# Patient Record
Sex: Male | Born: 1960
Health system: Southern US, Community
[De-identification: ages and names within clinical notes are randomized; demographics above are authoritative.]

## PROBLEM LIST (undated history)

## (undated) DIAGNOSIS — B1920 Unspecified viral hepatitis C without hepatic coma: Secondary | ICD-10-CM

## (undated) DIAGNOSIS — Z9889 Other specified postprocedural states: Secondary | ICD-10-CM

## (undated) DIAGNOSIS — R112 Nausea with vomiting, unspecified: Secondary | ICD-10-CM

## (undated) DIAGNOSIS — I10 Essential (primary) hypertension: Secondary | ICD-10-CM

## (undated) HISTORY — DX: Essential (primary) hypertension: I10

## (undated) HISTORY — DX: Unspecified viral hepatitis C without hepatic coma: B19.20

## (undated) HISTORY — PX: SHOULDER SURGERY: SHX246

## (undated) HISTORY — PX: HAND SURGERY: SHX662

---

## 1999-12-26 ENCOUNTER — Inpatient Hospital Stay (HOSPITAL_COMMUNITY): Admission: EM | Admit: 1999-12-26 | Discharge: 1999-12-28 | Payer: Self-pay

## 1999-12-26 ENCOUNTER — Encounter: Payer: Self-pay | Admitting: General Surgery

## 2000-06-05 ENCOUNTER — Emergency Department (HOSPITAL_COMMUNITY): Admission: EM | Admit: 2000-06-05 | Discharge: 2000-06-05 | Payer: Self-pay | Admitting: Emergency Medicine

## 2008-04-07 ENCOUNTER — Emergency Department (HOSPITAL_COMMUNITY): Admission: EM | Admit: 2008-04-07 | Discharge: 2008-04-07 | Payer: Self-pay | Admitting: Emergency Medicine

## 2010-03-06 ENCOUNTER — Ambulatory Visit (HOSPITAL_COMMUNITY)
Admission: RE | Admit: 2010-03-06 | Discharge: 2010-03-06 | Payer: Self-pay | Source: Home / Self Care | Attending: Family Medicine | Admitting: Family Medicine
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
## 2011-07-23 ENCOUNTER — Encounter (HOSPITAL_COMMUNITY): Payer: Self-pay | Admitting: *Deleted

## 2011-07-23 ENCOUNTER — Emergency Department (HOSPITAL_COMMUNITY)
Admission: EM | Admit: 2011-07-23 | Discharge: 2011-07-23 | Disposition: A | Discharge status: Home Health | Payer: No Typology Code available for payment source | Attending: Emergency Medicine | Admitting: Emergency Medicine

## 2011-07-23 DIAGNOSIS — Y92410 Unspecified street and highway as the place of occurrence of the external cause: Secondary | ICD-10-CM | POA: Insufficient documentation

## 2011-07-23 DIAGNOSIS — T148XXA Other injury of unspecified body region, initial encounter: Secondary | ICD-10-CM | POA: Insufficient documentation

## 2011-07-23 MED ORDER — TRAMADOL HCL 50 MG PO TABS
50.0000 mg | ORAL_TABLET | Freq: Four times a day (QID) | ORAL | Status: AC | PRN
Start: 2011-07-23 — End: 2011-08-02

## 2011-07-23 MED ORDER — HYDROCODONE-ACETAMINOPHEN 5-325 MG PO TABS
1.0000 | ORAL_TABLET | Freq: Once | ORAL | Status: AC
  Administered 2011-07-23: 1 via ORAL
  Filled 2011-07-23: qty 1

## 2011-07-23 MED ORDER — IBUPROFEN 800 MG PO TABS: 800.0000 mg | ORAL_TABLET | Freq: Three times a day (TID) | ORAL | Status: AC | PRN

## 2011-07-23 MED ORDER — METHOCARBAMOL 500 MG PO TABS: 1000.0000 mg | ORAL_TABLET | Freq: Four times a day (QID) | ORAL | Status: AC
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
## 2011-07-23 NOTE — Progress Notes (Signed)
Orthopedic Tech Progress Note Patient Details:  Juan Malone 1960-06-21 [id number]  Type of Splint: Other (comment) (velcro wrist splint) Splint Location: left wrist Splint Interventions: Application    [name] 07/23/2011, 8:44 PM
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
## 2011-07-23 NOTE — ED Provider Notes (Signed)
Medical screening examination/treatment/procedure(s) were performed by non-physician practitioner and as supervising physician I was immediately available for consultation/collaboration.   Glynn Octave, MD 07/23/11 [phone]

## 2011-07-29 ENCOUNTER — Encounter (HOSPITAL_COMMUNITY): Payer: Self-pay | Admitting: Emergency Medicine

## 2011-07-29 ENCOUNTER — Emergency Department (HOSPITAL_COMMUNITY)
Admission: EM | Admit: 2011-07-29 | Discharge: 2011-07-29 | Disposition: A | Discharge status: Home / Self Care | Payer: No Typology Code available for payment source | Attending: Emergency Medicine | Admitting: Emergency Medicine

## 2011-07-29 DIAGNOSIS — S139XXA Sprain of joints and ligaments of unspecified parts of neck, initial encounter: Secondary | ICD-10-CM | POA: Insufficient documentation

## 2011-07-29 DIAGNOSIS — M25519 Pain in unspecified shoulder: Secondary | ICD-10-CM

## 2011-07-29 DIAGNOSIS — M79609 Pain in unspecified limb: Secondary | ICD-10-CM | POA: Insufficient documentation

## 2011-07-29 DIAGNOSIS — S161XXA Strain of muscle, fascia and tendon at neck level, initial encounter: Secondary | ICD-10-CM

## 2011-07-29 DIAGNOSIS — F17200 Nicotine dependence, unspecified, uncomplicated: Secondary | ICD-10-CM | POA: Insufficient documentation

## 2011-07-29 DIAGNOSIS — M545 Low back pain, unspecified: Secondary | ICD-10-CM | POA: Insufficient documentation

## 2011-07-29 MED ORDER — NAPROXEN 500 MG PO TABS: 500.0000 mg | ORAL_TABLET | Freq: Two times a day (BID) | ORAL | Status: AC

## 2011-07-29 MED ORDER — HYDROCODONE-ACETAMINOPHEN 5-500 MG PO TABS: 1.0000 | ORAL_TABLET | Freq: Four times a day (QID) | ORAL | Status: AC | PRN

## 2011-07-29 MED ORDER — HYDROCODONE-ACETAMINOPHEN 5-325 MG PO TABS
2.0000 | ORAL_TABLET | Freq: Once | ORAL | Status: AC
  Administered 2011-07-29: 2 via ORAL
  Filled 2011-07-29: qty 2

## 2011-07-29 NOTE — Discharge Instructions (Signed)
Take naprosyn as directed for inflammation and pain using hydrocodone-acetaminophen as needed for breakthrough pain but do not drive or operate machinery with hydrocodone-acetaminophen use. Ice to any areas of soreness. It is important to followup with either a primary care provider or orthopedic specialist in the near future for further evaluation of ongoing pain. Return to emergency department for any emergent changing or worsening of symptoms. Establishment with a Primary Care provider is Very important for general health care concerns, minor illness and minor injury.   Arthralgia Arthralgia is joint pain. A joint is a place where two bones meet. Joint pain can happen for many reasons. The joint can be bruised, stiff, infected, or weak from aging. Pain usually goes away after resting and taking medicine for soreness.  HOME CARE  Rest the joint as told by your doctor.   Keep the sore joint raised (elevated) for the first 24 hours.   Put ice on the joint area.   Put ice in a plastic bag.   Place a towel between your skin and the bag.   Leave the ice on for 15 to 20 minutes, 3 to 4 times a day.   Wear your splint, casting, elastic bandage, or sling as told by your doctor.   Only take medicine as told by your doctor. Do not take aspirin.   Use crutches as told by your doctor. Do not put weight on the joint until told to by your doctor.  GET HELP RIGHT AWAY IF:   You have bruising, puffiness (swelling), or more pain.   Your fingers or toes turn blue or start to lose feeling (numb).   Your medicine does not lessen the pain.   Your pain becomes severe.   You have a temperature by mouth above 102 F (38.9 C), not controlled by medicine.   You cannot move or use the joint.  MAKE SURE YOU:   Understand these instructions.   Will watch your condition.   Will get help right away if you are not doing well or get worse.  Document Released: 02/25/2009 Document Revised: 02/26/2011  Document Reviewed: 02/25/2009 [facility] Patient Information 2012 [place], [place].
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
## 2011-07-29 NOTE — ED Provider Notes (Signed)
Medical screening examination/treatment/procedure(s) were performed by non-physician practitioner and as supervising physician I was immediately available for consultation/collaboration.   Filip Luten M Jamekia Gannett, MD 07/29/11 2334 

## 2011-07-30 ENCOUNTER — Encounter (HOSPITAL_COMMUNITY): Payer: Self-pay | Admitting: *Deleted

## 2011-08-20 ENCOUNTER — Emergency Department (HOSPITAL_COMMUNITY)
Admission: EM | Admit: 2011-08-20 | Discharge: 2011-08-20 | Disposition: A | Discharge status: Home / Self Care | Payer: No Typology Code available for payment source | Attending: Emergency Medicine | Admitting: Emergency Medicine

## 2011-08-20 ENCOUNTER — Emergency Department (HOSPITAL_COMMUNITY): Payer: No Typology Code available for payment source

## 2011-08-20 ENCOUNTER — Encounter (HOSPITAL_COMMUNITY): Payer: Self-pay | Admitting: Emergency Medicine

## 2011-08-20 DIAGNOSIS — M79642 Pain in left hand: Secondary | ICD-10-CM

## 2011-08-20 DIAGNOSIS — J45909 Unspecified asthma, uncomplicated: Secondary | ICD-10-CM | POA: Insufficient documentation

## 2011-08-20 DIAGNOSIS — M545 Low back pain, unspecified: Secondary | ICD-10-CM

## 2011-08-20 DIAGNOSIS — M79609 Pain in unspecified limb: Secondary | ICD-10-CM | POA: Insufficient documentation

## 2011-08-20 DIAGNOSIS — M25519 Pain in unspecified shoulder: Secondary | ICD-10-CM

## 2011-08-20 MED ORDER — CYCLOBENZAPRINE HCL 10 MG PO TABS: 10.0000 mg | ORAL_TABLET | Freq: Two times a day (BID) | ORAL | Status: AC | PRN

## 2011-08-20 MED ORDER — HYDROCODONE-ACETAMINOPHEN 5-500 MG PO TABS: 1.0000 | ORAL_TABLET | Freq: Four times a day (QID) | ORAL | Status: AC | PRN

## 2011-08-20 MED ORDER — OXYCODONE-ACETAMINOPHEN 5-325 MG PO TABS
1.0000 | ORAL_TABLET | Freq: Once | ORAL | Status: AC
  Administered 2011-08-20: 1 via ORAL
  Filled 2011-08-20: qty 1
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
## 2011-08-20 NOTE — Discharge Instructions (Signed)
Acromioclavicular Injuries  The AC (acromioclavicular) joint is the joint in the shoulder where the collarbone (clavicle) meets the shoulder blade (scapula). The part of the shoulder blade connected to the collarbone is called the acromion. Common problems with and treatments for the AC joint are detailed below.  ARTHRITIS  Arthritis occurs when the joint has been injured and the smooth padding between the joints (cartilage) is lost. This is the wear and tear seen in most joints of the body if they have been overused. This causes the joint to produce pain and swelling which is worse with activity.   AC JOINT SEPARATION  AC joint separation means that the ligaments connecting the acromion of the shoulder blade and collarbone have been damaged, and the two bones no longer line up. AC separations can be anywhere from mild to severe, and are "graded" depending upon which ligaments are torn and how badly they are torn.   Grade I Injury: the least damage is done, and the AC joint still lines up.   Grade II Injury: damage to the ligaments which reinforce the AC joint. In a Grade II injury, these ligaments are stretched but not entirely torn. When stressed, the AC joint becomes painful and unstable.   Grade III Injury: AC and secondary ligaments are completely torn, and the collarbone is no longer attached to the shoulder blade. This results in deformity; a prominence of the end of the clavicle.  AC JOINT FRACTURE  AC joint fracture means that there has been a break in the bones of the AC joint, usually the end of the clavicle.  TREATMENT  TREATMENT OF AC ARTHRITIS   There is currently no way to replace the cartilage damaged by arthritis. The best way to improve the condition is to decrease the activities which aggravate the problem. Application of ice to the joint helps decrease pain and soreness (inflammation). The use of non-steroidal anti-inflammatory medication is helpful.   If less conservative measures do not  work, then cortisone shots (injections) may be used. These are anti-inflammatories; they decrease the soreness in the joint and swelling.   If non-surgical measures fail, surgery may be recommended. The procedure is generally removal of a portion of the end of the clavicle. This is the part of the collarbone closest to your acromion which is stabilized with ligaments to the acromion of the shoulder blade. This surgery may be performed using a tube-like instrument with a light (arthroscope) for looking into a joint. It may also be performed as an open surgery through a small incision by the surgeon. Most patients will have good range of motion within 6 weeks and may return to all activity including sports by 8-12 weeks, barring complications.  TREATMENT OF AN AC SEPARATION   The initial treatment is to decrease pain. This is best accomplished by immobilizing the arm in a sling and placing an ice pack to the shoulder for 20 to 30 minutes every 2 hours as needed. As the pain starts to subside, it is important to begin moving the fingers, wrist, elbow and eventually the shoulder in order to prevent a stiff or "frozen" shoulder. Instruction on when and how much to move the shoulder will be provided by your caregiver. The length of time needed to regain full motion and function depends on the amount or grade of the injury. Recovery from a Grade I AC separation usually takes 10 to 14 days, whereas a Grade III may take 6 to 8 weeks.   Grade   III injuries usually allow return to full activity with few restrictions. Treatment is also based on the activity demands of the injured shoulder. For example, a high level quarterback with an injured throwing arm will receive more aggressive treatment than someone with a desk job who rarely uses his/her arm for strenuous activities. In some cases, a painful lump may persist which could require a later surgery.  Surgery can be very successful, but the benefits must be weighed against the potential risks.  TREATMENT OF AN AC JOINT FRACTURE Fracture treatment depends on the type of fracture. Sometimes a splint or sling may be all that is required. Other times surgery may be required for repair. This is more frequently the case when the ligaments supporting the clavicle are completely torn. Your caregiver will help you with these decisions and together you can decide what will be the best treatment. HOME CARE INSTRUCTIONS   Apply ice to the injury for 15 to 20 minutes each hour while awake for 2 days. Put the ice in a plastic bag and place a towel between the bag of ice and skin.   If a sling has been applied, wear it constantly for as long as directed by your caregiver, even at night. The sling or splint can be removed for bathing or showering or as directed. Be sure to keep the shoulder in the same place as when the sling is on. Do not lift the arm.   If a figure-of-eight splint has been applied it should be tightened gently by another person every day. Tighten it enough to keep the shoulders held back. Allow enough room to place the index finger between the body and strap. Loosen the splint immediately if there is numbness or tingling in the hands.   Take over-the-counter or prescription medicines for pain, discomfort or fever as directed by your caregiver.   If you or your child has received a follow up appointment, it is very important to keep that appointment in order to avoid long term complications, chronic pain or disability.  SEEK MEDICAL CARE IF:   The pain is not relieved with medications.   There is increased swelling or discoloration that continues to get worse rather than better.   You or your child has been unable to follow up as instructed.   There is progressive numbness and tingling in the arm, forearm or hand.  SEEK IMMEDIATE MEDICAL CARE IF:   The arm is numb, cold or pale.    There is increasing pain in the hand, forearm or fingers.  MAKE SURE YOU:   Understand these instructions.   Will watch your condition.   Will get help right away if you are not doing well or get worse.  Document Released: 12/17/2004 Document Revised: 02/26/2011 Document Reviewed: 06/11/2008 [facility] Patient Information 2012 [place], [place].Back Exercises Back exercises help treat and prevent back injuries. The goal of back exercises is to increase the strength of your abdominal and back muscles and the flexibility of your back. These exercises should be started when you no longer have back pain. Back exercises include:  Pelvic Tilt. Lie on your back with your knees bent. Tilt your pelvis until the lower part of your back is against the floor. Hold this position 5 to 10 sec and repeat 5 to 10 times.   Knee to Chest. Pull first 1 knee up against your chest and hold for 20 to 30 seconds, repeat this with the other knee, and then both knees. This may  be done with the other leg straight or bent, whichever feels better.   Sit-Ups or Curl-Ups. Bend your knees 90 degrees. Start with tilting your pelvis, and do a partial, slow sit-up, lifting your trunk only 30 to 45 degrees off the floor. Take at least 2 to 3 seconds for each sit-up. Do not do sit-ups with your knees out straight. If partial sit-ups are difficult, simply do the above but with only tightening your abdominal muscles and holding it as directed.   Hip-Lift. Lie on your back with your knees flexed 90 degrees. Push down with your feet and shoulders as you raise your hips a couple inches off the floor; hold for 10 seconds, repeat 5 to 10 times.   Back arches. Lie on your stomach, propping yourself up on bent elbows. Slowly press on your hands, causing an arch in your low back. Repeat 3 to 5 times. Any initial stiffness and discomfort should lessen with repetition over time.   Shoulder-Lifts. Lie face down with arms beside your body. Keep  hips and torso pressed to floor as you slowly lift your head and shoulders off the floor.  Do not overdo your exercises, especially in the beginning. Exercises may cause you some mild back discomfort which lasts for a few minutes; however, if the pain is more severe, or lasts for more than 15 minutes, do not continue exercises until you see your caregiver. Improvement with exercise therapy for back problems is slow.  See your caregivers for assistance with developing a proper back exercise program. Document Released: 04/16/2004 Document Revised: 02/26/2011 Document Reviewed: 03/09/2005 [facility] Patient Information 2012 [place], [place].Back Pain, Adult Low back pain is very common. About 1 in 5 people have back pain.The cause of low back pain is rarely dangerous. The pain often gets better over time.About half of people with a sudden onset of back pain feel better in just 2 weeks. About 8 in 10 people feel better by 6 weeks.  CAUSES Some common causes of back pain include:  Strain of the muscles or ligaments supporting the spine.   Wear and tear (degeneration) of the spinal discs.   Arthritis.   Direct injury to the back.  DIAGNOSIS Most of the time, the direct cause of low back pain is not known.However, back pain can be treated effectively even when the exact cause of the pain is unknown.Answering your caregiver's questions about your overall health and symptoms is one of the most accurate ways to make sure the cause of your pain is not dangerous. If your caregiver needs more information, he or she may order lab work or imaging tests (X-rays or MRIs).However, even if imaging tests show changes in your back, this usually does not require surgery. HOME CARE INSTRUCTIONS For many people, back pain returns.Since low back pain is rarely dangerous, it is often a condition that people can learn to [facility] their own.   Remain active. It is stressful on the back to sit or stand in one place. Do  not sit, drive, or stand in one place for more than 30 minutes at a time. Take short walks on level surfaces as soon as pain allows.Try to increase the length of time you walk each day.   Do not stay in bed.Resting more than 1 or 2 days can delay your recovery.   Do not avoid exercise or work.Your body is made to move.It is not dangerous to be active, even though your back may hurt.Your back will likely heal faster  if you return to being active before your pain is gone.   Pay attention to your body when you bend and lift. Many people have less discomfortwhen lifting if they bend their knees, keep the load close to their bodies,and avoid twisting. Often, the most comfortable positions are those that put less stress on your recovering back.   Find a comfortable position to sleep. Use a firm mattress and lie on your side with your knees slightly bent. If you lie on your back, put a pillow under your knees.   Only take over-the-counter or prescription medicines as directed by your caregiver. Over-the-counter medicines to reduce pain and inflammation are often the most helpful.Your caregiver may prescribe muscle relaxant drugs.These medicines help dull your pain so you can more quickly return to your normal activities and healthy exercise.   Put ice on the injured area.   Put ice in a plastic bag.   Place a towel between your skin and the bag.   Leave the ice on for 15 to 20 minutes, 3 to 4 times a day for the first 2 to 3 days. After that, ice and heat may be alternated to reduce pain and spasms.   Ask your caregiver about trying back exercises and gentle massage. This may be of some benefit.   Avoid feeling anxious or stressed.Stress increases muscle tension and can worsen back pain.It is important to recognize when you are anxious or stressed and learn ways to manage it.Exercise is a great option.  SEEK MEDICAL CARE IF:  You have pain that is not relieved with rest or medicine.     You have pain that does not improve in 1 week.   You have new symptoms.   You are generally not feeling well.  SEEK IMMEDIATE MEDICAL CARE IF:   You have pain that radiates from your back into your legs.   You develop new bowel or bladder control problems.   You have unusual weakness or numbness in your arms or legs.   You develop nausea or vomiting.   You develop abdominal pain.   You feel faint.  Document Released: 03/09/2005 Document Revised: 02/26/2011 Document Reviewed: 07/28/2010 [facility] Patient Information 2012 [place], [place].
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
## 2011-09-01 NOTE — ED Provider Notes (Signed)
Medical screening examination/treatment/procedure(s) were performed by non-physician practitioner and as supervising physician I was immediately available for consultation/collaboration.   Rolan Bucco, MD 09/01/11 1008

## 2016-06-15 DIAGNOSIS — J45909 Unspecified asthma, uncomplicated: Secondary | ICD-10-CM | POA: Insufficient documentation

## 2016-06-15 DIAGNOSIS — F17210 Nicotine dependence, cigarettes, uncomplicated: Secondary | ICD-10-CM | POA: Insufficient documentation

## 2016-06-15 DIAGNOSIS — M62830 Muscle spasm of back: Secondary | ICD-10-CM | POA: Insufficient documentation

## 2016-06-15 DIAGNOSIS — R109 Unspecified abdominal pain: Secondary | ICD-10-CM | POA: Insufficient documentation

## 2016-06-15 NOTE — ED Triage Notes (Signed)
Pt brought in by EMS with c/o right side back pain that he has had for some time but has gotten worse over the past couple days

## 2016-06-16 ENCOUNTER — Emergency Department (HOSPITAL_COMMUNITY): Payer: Self-pay

## 2016-06-16 ENCOUNTER — Encounter (HOSPITAL_COMMUNITY): Payer: Self-pay | Admitting: Emergency Medicine

## 2016-06-16 ENCOUNTER — Emergency Department (HOSPITAL_COMMUNITY)
Admission: EM | Admit: 2016-06-16 | Discharge: 2016-06-16 | Disposition: A | Discharge status: Home / Self Care | Payer: Self-pay | Attending: Emergency Medicine | Admitting: Emergency Medicine

## 2016-06-16 DIAGNOSIS — R52 Pain, unspecified: Secondary | ICD-10-CM

## 2016-06-16 DIAGNOSIS — M62838 Other muscle spasm: Secondary | ICD-10-CM

## 2016-06-16 LAB — I-STAT CHEM 8, ED
BUN: 5 mg/dL — ABNORMAL LOW (ref 6–20)
CHLORIDE: 99 mmol/L — AB (ref 101–111)
CREATININE: 1.2 mg/dL (ref 0.61–1.24)
Calcium, Ion: 1.05 mmol/L — ABNORMAL LOW (ref 1.15–1.40)
Glucose, Bld: 94 mg/dL (ref 65–99)
HEMATOCRIT: 38 % — AB (ref 39.0–52.0)
HEMOGLOBIN: 12.9 g/dL — AB (ref 13.0–17.0)
Potassium: 3.8 mmol/L (ref 3.5–5.1)
Sodium: 137 mmol/L (ref 135–145)
TCO2: 25 mmol/L (ref 0–100)

## 2016-06-16 LAB — URINALYSIS, ROUTINE W REFLEX MICROSCOPIC
Bilirubin Urine: NEGATIVE
GLUCOSE, UA: NEGATIVE mg/dL
Hgb urine dipstick: NEGATIVE
KETONES UR: NEGATIVE mg/dL
LEUKOCYTES UA: NEGATIVE
NITRITE: NEGATIVE
PH: 5 (ref 5.0–8.0)
Protein, ur: NEGATIVE mg/dL
SPECIFIC GRAVITY, URINE: 1.008 (ref 1.005–1.030)

## 2016-06-16 MED ORDER — OXYCODONE-ACETAMINOPHEN 5-325 MG PO TABS
1.0000 | ORAL_TABLET | Freq: Once | ORAL | Status: AC
  Administered 2016-06-16: 1 via ORAL
  Filled 2016-06-16: qty 1

## 2016-06-16 MED ORDER — METHOCARBAMOL 500 MG PO TABS
1000.0000 mg | ORAL_TABLET | Freq: Once | ORAL | Status: AC
  Administered 2016-06-16: 1000 mg via ORAL
  Filled 2016-06-16: qty 2

## 2016-06-16 MED ORDER — NAPROXEN 375 MG PO TABS: 375.0000 mg | ORAL_TABLET | Freq: Two times a day (BID) | ORAL | 0 refills | Status: DC

## 2016-06-16 MED ORDER — KETOROLAC TROMETHAMINE 60 MG/2ML IM SOLN
60.0000 mg | Freq: Once | INTRAMUSCULAR | Status: AC
  Administered 2016-06-16: 60 mg via INTRAMUSCULAR
  Filled 2016-06-16: qty 2

## 2016-06-16 MED ORDER — METHOCARBAMOL 500 MG PO TABS: 500.0000 mg | ORAL_TABLET | Freq: Two times a day (BID) | ORAL | 0 refills | Status: DC

## 2016-06-16 NOTE — ED Provider Notes (Signed)
[facility]-EMERGENCY DEPT Provider Note   CSN: [id number] Arrival date & time: 06/15/16  2350  By signing my name below, I, [name], attest that this documentation has been prepared under the direction and in the presence of Madix Blowe, MD . Electronically Signed: [name], Scribe. 06/16/2016. 2:13 AM.  History   Chief Complaint Chief Complaint  Patient presents with  . Flank Pain   The history is provided by the patient. No language interpreter was used.  Back Pain   This is a new problem. The current episode started 6 to 12 hours ago. The problem occurs constantly. The problem has not changed since onset.The pain is associated with no known injury. The pain is present in the lumbar spine. The quality of the pain is described as shooting. The pain does not radiate. The pain is moderate. The symptoms are aggravated by bending. Pertinent negatives include no chest pain, no fever, no numbness, no weight loss, no headaches, no abdominal pain, no abdominal swelling, no bowel incontinence, no perianal numbness, no bladder incontinence, no dysuria, no pelvic pain, no leg pain, no paresthesias, no paresis, no tingling and no weakness. He has tried nothing for the symptoms. The treatment provided no relief. Risk factors: none.    HPI Comments:  Juan Malone is a 56 y.o. male with no pertinent pmhx who presents to the Emergency Department complaining of constant, mild-moderate right lower back pain onset today. Pt describes his pain as shooting, non-radiating, and exacerbated by movement and palpation. 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 Denies any dysuria, hematuria, difficulty urinating, or odorous urine.    Past Medical History:  Diagnosis Date  . Asthma     There are no active problems to display for this patient.   Past Surgical History:  Procedure Laterality Date  . HAND SURGERY    . SHOULDER SURGERY          Home Medications    Prior to Admission medications   Medication Sig Start Date End Date Taking? Authorizing Provider  acetaminophen (TYLENOL) 500 MG tablet Take 500 mg by mouth every 6 (six) hours as needed for mild pain, moderate pain or headache.   Yes Historical Provider, MD    Family History Family History  Problem Relation Age of Onset  . Family history unknown: Yes    Social History Social History  Substance Use Topics  . Smoking status: Current Every Day Smoker    Years: 5.00    Types: Cigarettes  . Smokeless tobacco: Never Used  . Alcohol use Yes     Comment: occassionally     Allergies   Penicillins   Review of Systems Review of Systems  Constitutional: Negative for fever and weight loss.  Cardiovascular: Negative for chest pain.  Gastrointestinal: Negative for abdominal pain and bowel incontinence.  Genitourinary: Negative for bladder incontinence, difficulty urinating, dysuria, hematuria and pelvic pain.       Negative for Odorous Urine  Musculoskeletal: Positive for back pain. Negative for gait problem.  Neurological: Negative for tingling, weakness, numbness, headaches and paresthesias.  All other systems reviewed and are negative.    Physical Exam Updated Vital Signs BP (!) 150/112 (BP Location: Right Arm)   Pulse 90   Temp 98.4 F (36.9 C) (Oral)   Resp 16   Ht 5\' 10"  (1.778 m)   Wt 141 lb (64 kg)   SpO2 99%   BMI 20.23 kg/m  Physical Exam  Constitutional: He is oriented to person, place, and time. He appears well-developed and well-nourished. No distress.  HENT:  Head: Normocephalic and atraumatic.  Mouth/Throat: Oropharynx is clear and moist. No oropharyngeal exudate.  Eyes: Conjunctivae are normal. Pupils are equal, round, and reactive to light.  Neck: Normal range of motion.  Cardiovascular: Normal rate, regular rhythm and normal heart sounds.   Pulmonary/Chest: Effort normal and breath sounds normal. No respiratory  distress.  Abdominal: Soft. Bowel sounds are normal. He exhibits no distension and no mass. There is no tenderness. There is no rebound and no guarding.  Musculoskeletal: Normal range of motion. He exhibits no edema, tenderness or deformity.  Pain reproducible with palpation  Neurological: He is alert and oriented to person, place, and time. He has normal reflexes. He displays normal reflexes.  Skin: Skin is warm and dry. Capillary refill takes less than 2 seconds.  Psychiatric: He has a normal mood and affect. His behavior is normal.  Nursing note and vitals reviewed.    ED Treatments / Results   Vitals:   06/15/16 2359 06/16/16 0235  BP: (!) 150/112 (!) 152/100  Pulse: 90 82  Resp: 16 18  Temp: 98.4 F (36.9 C)     DIAGNOSTIC STUDIES:  Oxygen Saturation is 99% on RA, normal by my interpretation.    COORDINATION OF CARE:  2:38 AM Discussed treatment plan with pt at bedside which includes imaging and pt agreed to plan.  Labs (all labs ordered are listed, but only abnormal results are displayed) Results for orders placed or performed during the hospital encounter of 06/16/16  Urinalysis, Routine w reflex microscopic- may I&O cath if menses  Result Value Ref Range   Color, Urine YELLOW YELLOW   APPearance CLEAR CLEAR   Specific Gravity, Urine 1.008 1.005 - 1.030   pH 5.0 5.0 - 8.0   Glucose, UA NEGATIVE NEGATIVE mg/dL   Hgb urine dipstick NEGATIVE NEGATIVE   Bilirubin Urine NEGATIVE NEGATIVE   Ketones, ur NEGATIVE NEGATIVE mg/dL   Protein, ur NEGATIVE NEGATIVE mg/dL   Nitrite NEGATIVE NEGATIVE   Leukocytes, UA NEGATIVE NEGATIVE  I-stat chem 8, ed  Result Value Ref Range   Sodium 137 135 - 145 mmol/L   Potassium 3.8 3.5 - 5.1 mmol/L   Chloride 99 (L) 101 - 111 mmol/L   BUN 5 (L) 6 - 20 mg/dL   Creatinine, Ser 9.60 0.61 - 1.24 mg/dL   Glucose, Bld 94 65 - 99 mg/dL   Calcium, Ion 4.54 (L) 1.15 - 1.40 mmol/L   TCO2 25 0 - 100 mmol/L   Hemoglobin 12.9 (L) 13.0 - 17.0  g/dL   HCT 09.8 (L) 11.9 - 14.7 %   Ct Renal Stone Study  Result Date: 06/16/2016 CLINICAL DATA:  Right flank/ lower back pain. EXAM: CT ABDOMEN AND PELVIS WITHOUT CONTRAST TECHNIQUE: Multidetector CT imaging of the abdomen and pelvis was performed following the standard protocol without IV contrast. COMPARISON:  None. FINDINGS: Lower chest: The lung bases are clear. Hepatobiliary: No evidence of focal hepatic lesion allowing for lack contrast. Gallbladder physiologically distended, no calcified stone. No biliary dilatation. Pancreas: No peripancreatic inflammation. Borderline pancreatic ductal prominence of 3 mm. Spleen: Normal in size without focal abnormality. Adrenals/Urinary Tract: No hydronephrosis or perinephric edema. No urolithiasis. Ureters are decompressed without stones along the course. Urinary bladder is minimally distended without stone. Normal adrenal glands. Stomach/Bowel: Mild diverticulosis of the distal transverse, descending, and sigmoid colon. No acute inflammation. Normal appendix. No small  bowel dilatation, wall thickening or abnormal distention. No bowel obstruction. Unenhanced stomach is unremarkable. Vascular/Lymphatic: Aortic atherosclerosis. No enlarged abdominal or pelvic lymph nodes. Reproductive: Normal sized prostate gland with coarse calcifications. Other: No free air, free fluid, or intra-abdominal fluid collection. Musculoskeletal: There are no acute or suspicious osseous abnormalities. Disc space narrowing and endplate spurring at L5-S1. IMPRESSION: 1.  No renal stones or obstructive uropathy.  No acute abnormality. 2. Mild aortic atherosclerosis without aneurysm. 3. Colonic diverticulosis without inflammation. 4. Degenerative disc disease at L5-S1. Electronically Signed   By: Rubye OaksMelanie  Ehinger M.D.   On: 06/16/2016 04:17     Radiology No results found.  Procedures Procedures (including critical care time)  Medications Ordered in ED Medications   oxyCODONE-acetaminophen (PERCOCET/ROXICET) 5-325 MG per tablet 1 tablet (1 tablet Oral Given 06/16/16 0123)  ketorolac (TORADOL) injection 60 mg (60 mg Intramuscular Given 06/16/16 0300)  methocarbamol (ROBAXIN) tablet 1,000 mg (1,000 mg Oral Given 06/16/16 0259)     Final Clinical Impressions(s) / ED Diagnoses  Muscle spasm:  NSAIDS, heat and muscle relaxants follow up with your pmd for ongoing.  Abdominal wall pain:  The patient is nontoxic-appearing on exam and vital signs are within normal limits. Return for vomiting, inability to pass urine or stool, fevers intractable pain or any concerns.  After history, exam, and medical workup I feel the patient has been appropriately medically screened and is safe for discharge home. Pertinent diagnoses were discussed with the patient. Patient was given return precautions.  I personally performed the services described in this documentation, which was scribed in my presence. The recorded information has been reviewed and is accurate.      Cy BlamerApril Deepak Bless, MD 06/16/16 [phone]

## 2016-06-16 NOTE — ED Notes (Signed)
Pt is anxious and is unable to sit still d/t pain, Pt states that he has right sided flank pain, and that he has never had a similar type of pain before. Pt has intermittent yelling, and outburts d/t Informed pt that I would try to obtain lab and start and IV on him pt stated " you not sticking a needle in my arm".

## 2017-01-17 ENCOUNTER — Emergency Department (HOSPITAL_COMMUNITY)
Admission: EM | Admit: 2017-01-17 | Discharge: 2017-01-17 | Disposition: A | Discharge status: Home / Self Care | Payer: Self-pay | Attending: Emergency Medicine | Admitting: Emergency Medicine

## 2017-01-17 ENCOUNTER — Encounter (HOSPITAL_COMMUNITY): Payer: Self-pay

## 2017-01-17 DIAGNOSIS — J45909 Unspecified asthma, uncomplicated: Secondary | ICD-10-CM | POA: Insufficient documentation

## 2017-01-17 DIAGNOSIS — Z79899 Other long term (current) drug therapy: Secondary | ICD-10-CM | POA: Insufficient documentation

## 2017-01-17 DIAGNOSIS — B353 Tinea pedis: Secondary | ICD-10-CM | POA: Insufficient documentation

## 2017-01-17 DIAGNOSIS — F17210 Nicotine dependence, cigarettes, uncomplicated: Secondary | ICD-10-CM | POA: Insufficient documentation

## 2017-01-17 MED ORDER — CLOTRIMAZOLE 1 % EX CREA: TOPICAL_CREAM | CUTANEOUS | 0 refills | Status: DC

## 2017-01-17 NOTE — ED Notes (Signed)
Bed: WTR8 Expected date:  Expected time:  Means of arrival:  Comments: 

## 2017-01-17 NOTE — ED Triage Notes (Signed)
Patient here with c/o pain to the right little between the toes. Pt state it been going on for a couple of days.

## 2017-01-17 NOTE — Discharge Instructions (Signed)
Use lotrimin cream as directed to the affected area, until it clears up. Keep your feel as dry as possible, using a blow dryer on cool setting in order to dry them out. If your feet get sweaty, change your socks. Use lotrimin powder or baby powder in your shoes to help keep them dry as well. Alternate between tylenol and motrin as needed for pain. Use warm water soaks to help with pain. Follow up with the podiatrist in the next 5-7 days for recheck of symptoms and ongoing management of your foot issue. Return to the ER for emergent changes or worsening symptoms.

## 2017-01-17 NOTE — ED Provider Notes (Signed)
[facility]-EMERGENCY DEPT Provider Note   CSN: [id number] Arrival date & time: 01/17/17  1106     History   Chief Complaint No chief complaint on file.   HPI Juan Malone is a 56 y.o. male with a PMHx of asthma, who presents to the ED with complaints of right pinky toe pain 3 days. He describes the pain as 10/10 constant sharp and burning nonradiating right pinky toe pain, worse with walking, and unrelieved with ibuprofen. He reports associated purplish blister to the inner portion of the toe. He also mentions that he's had issues with his big toe toenails for quite some time and wants to be evaluated for removal of these toenails. He denies any injury or trauma to the pinky toe. He denies any fevers, chills, warmth, erythema, red streaking, drainage, swelling, numbness, tingling, focal weakness, or any other complaints at this time.   The history is provided by the patient and medical records. No language interpreter was used.  Toe Pain  This is a new problem. The current episode started more than 2 days ago. The problem occurs constantly. The problem has not changed since onset.The symptoms are aggravated by walking. Nothing relieves the symptoms. Treatments tried: ibuprofen. The treatment provided no relief.    Past Medical History:  Diagnosis Date  . Asthma     There are no active problems to display for this patient.   Past Surgical History:  Procedure Laterality Date  . HAND SURGERY    . SHOULDER SURGERY         Home Medications    Prior to Admission medications   Medication Sig Start Date End Date Taking? Authorizing Provider  acetaminophen (TYLENOL) 500 MG tablet Take 500 mg by mouth every 6 (six) hours as needed for mild pain, moderate pain or headache.    [provider]  methocarbamol (ROBAXIN) 500 MG tablet Take 1 tablet (500 mg total) by mouth 2 (two) times daily. 06/16/16   Palumbo, April, MD  naproxen (NAPROSYN) 375 MG  tablet Take 1 tablet (375 mg total) by mouth 2 (two) times daily. 06/16/16   Palumbo, April, MD    Family History Family History  Problem Relation Age of Onset  . Family history unknown: Yes    Social History Social History  Substance Use Topics  . Smoking status: Current Every Day Smoker    Years: 5.00    Types: Cigarettes  . Smokeless tobacco: Never Used  . Alcohol use Yes     Comment: occassionally     Allergies   Penicillins   Review of Systems Review of Systems  Constitutional: Negative for chills and fever.  Musculoskeletal: Positive for arthralgias. Negative for joint swelling.  Skin: Positive for color change and wound.  Allergic/Immunologic: Negative for immunocompromised state.  Neurological: Negative for weakness and numbness.     Physical Exam Updated Vital Signs BP 122/79 (BP Location: Left Arm)   Pulse 68   Temp 98.6 F (37 C) (Oral)   Resp 18   SpO2 99%   Physical Exam  Constitutional: He is oriented to person, place, and time. Vital signs are normal. He appears well-developed and well-nourished.  Non-toxic appearance. No distress.  Afebrile, nontoxic, NAD  HENT:  Head: Normocephalic and atraumatic.  Mouth/Throat: Mucous membranes are normal.  Eyes: Conjunctivae and EOM are normal. Right eye exhibits no discharge. Left eye exhibits no discharge.  Neck: Normal range of motion. Neck supple.  Cardiovascular: Normal rate and intact distal pulses.  Pulmonary/Chest: Effort normal. No respiratory distress.  Abdominal: Normal appearance. He exhibits no distension.  Musculoskeletal: Normal range of motion.  Neurological: He is alert and oriented to person, place, and time. He has normal strength. No sensory deficit.  Skin: Skin is warm, dry and intact. Lesion noted. No rash noted.  R 5th toe with slightly macerated skin in the interdigital webspace, small callus to inner edge of pinky toe which is mildly TTP, no focal bony TTP, FROM intact, no crepitus  or deformity, no erythema or warmth, no drainage. No evidence of cellulitis. Toenails hypertrophic and yellowed, consistent with fungal infection. NVI with soft compartments.   Psychiatric: He has a normal mood and affect.  Nursing note and vitals reviewed.    ED Treatments / Results  Labs (all labs ordered are listed, but only abnormal results are displayed) Labs Reviewed - No data to display  EKG  EKG Interpretation None       Radiology No results found.  Procedures Procedures (including critical care time)  Medications Ordered in ED Medications - No data to display   Initial Impression / Assessment and Plan / ED Course  I have reviewed the triage vital signs and the nursing notes.  Pertinent labs & imaging results that were available during my care of the patient were reviewed by me and considered in my medical decision making (see chart for details).     56 y.o. male here with irritation to R little toe, consistent with a fungal infection; has a small callus to inner toe, likely from chronic friction. No evidence of secondary bacterial infection, NVI with soft compartments. Advised tylenol/motrin/heat for pain, discussed keeping feet as dry as possible, and will start on lotrimin cream. Advised powder in shoes to help keep them dry as well. F/up with podiatry in 5-7 days for recheck and ongoing management. I explained the diagnosis and have given explicit precautions to return to the ER including for any other new or worsening symptoms. The patient understands and accepts the medical plan as it's been dictated and I have answered their questions. Discharge instructions concerning home care and prescriptions have been given. The patient is STABLE and is discharged to home in good condition.    Final Clinical Impressions(s) / ED Diagnoses   Final diagnoses:  Tinea pedis of right foot    New Prescriptions New Prescriptions   CLOTRIMAZOLE (LOTRIMIN) 1 % CREAM    Apply  to affected area 2 times daily     [address], [place], [place] 01/17/17 1223    Jacalyn LefevreHaviland, Julie, MD 01/17/17 1342

## 2017-01-27 ENCOUNTER — Ambulatory Visit: Payer: Self-pay | Admitting: Podiatry

## 2017-02-01 ENCOUNTER — Encounter: Payer: Self-pay | Admitting: Podiatry

## 2017-02-01 ENCOUNTER — Ambulatory Visit (INDEPENDENT_AMBULATORY_CARE_PROVIDER_SITE_OTHER): Payer: Self-pay | Admitting: Podiatry

## 2017-02-01 DIAGNOSIS — L989 Disorder of the skin and subcutaneous tissue, unspecified: Secondary | ICD-10-CM

## 2017-02-01 DIAGNOSIS — L603 Nail dystrophy: Secondary | ICD-10-CM

## 2017-02-01 NOTE — Progress Notes (Signed)
   Subjective:    Patient ID: Juan Malone, male    DOB: 07-28-1960, 56 y.o.   MRN: [id number]  HPI     Review of Systems  All other systems reviewed and are negative.      Objective:   Physical Exam        Assessment & Plan:

## 2017-02-02 NOTE — Progress Notes (Signed)
   Subjective: Patient presents today for possible treatment and evaluation of fungus to bilateral great toenails that have been present for the past several months. He reports associated thickening and discoloration of the nails as well as pain. He is also concerned for possible tinea pedis in the interdigital spaces of the right foot. He was seen in the ED on 01/17/17 and was prescribed Lotrimin cream but has not gotten it filled because he states it was too expensive. Patient presents today for further treatment and evaluation.   Past Medical History:  Diagnosis Date  . Asthma     Objective: Physical Exam General: The patient is alert and oriented x3 in no acute distress.  Dermatology: Hyperkeratotic, discolored, thickened, onychodystrophy of bilateral great toenails.  Hyperkeratotic lesion present in the interdigital area of the right foot. Pain on palpation with a central nucleated core noted.  Skin is warm, dry and supple bilateral lower extremities. Negative for open lesions or macerations.  Vascular: Palpable pedal pulses bilaterally. No edema or erythema noted. Capillary refill within normal limits.  Neurological: Epicritic and protective threshold grossly intact bilaterally.   Musculoskeletal Exam: Range of motion within normal limits to all pedal and ankle joints bilateral. Muscle strength 5/5 in all groups bilateral.   Assessment: #1 dystrophic bilateral great toenails #2 possible onychomycosis #3 hyperkeratotic great toenails bilateral #4 interdigital callus lesion right foot  Plan of Care:  #1 Patient was evaluated. #2 Mechanical debridement of bilateral great toenails performed using a nail nipper. Filed with dremel without incident.  #3 Excisional debridement of keratoic lesion using a chisel blade was performed without incident.  #4 Dressed with light dressing. #5 Return to clinic when necessary.     Felecia ShellingBrent M. Evans, DPM [facility]  Dr. Felecia ShellingBrent  M. Evans, DPM    [address]                                        [place], [place] [address]                Office [phone]  Fax [phone]

## 2017-02-14 ENCOUNTER — Emergency Department (HOSPITAL_COMMUNITY): Payer: Medicaid Other

## 2017-02-14 ENCOUNTER — Emergency Department (HOSPITAL_COMMUNITY)
Admission: EM | Admit: 2017-02-14 | Discharge: 2017-02-14 | Disposition: A | Discharge status: Home / Self Care | Payer: Medicaid Other | Attending: Emergency Medicine | Admitting: Emergency Medicine

## 2017-02-14 DIAGNOSIS — Y9389 Activity, other specified: Secondary | ICD-10-CM | POA: Insufficient documentation

## 2017-02-14 DIAGNOSIS — Y92009 Unspecified place in unspecified non-institutional (private) residence as the place of occurrence of the external cause: Secondary | ICD-10-CM | POA: Diagnosis not present

## 2017-02-14 DIAGNOSIS — J45909 Unspecified asthma, uncomplicated: Secondary | ICD-10-CM | POA: Diagnosis not present

## 2017-02-14 DIAGNOSIS — W108XXA Fall (on) (from) other stairs and steps, initial encounter: Secondary | ICD-10-CM | POA: Diagnosis not present

## 2017-02-14 DIAGNOSIS — F17210 Nicotine dependence, cigarettes, uncomplicated: Secondary | ICD-10-CM | POA: Diagnosis not present

## 2017-02-14 DIAGNOSIS — Y998 Other external cause status: Secondary | ICD-10-CM | POA: Insufficient documentation

## 2017-02-14 DIAGNOSIS — S82041A Displaced comminuted fracture of right patella, initial encounter for closed fracture: Secondary | ICD-10-CM | POA: Diagnosis not present

## 2017-02-14 DIAGNOSIS — S8991XA Unspecified injury of right lower leg, initial encounter: Secondary | ICD-10-CM | POA: Diagnosis present

## 2017-02-14 MED ORDER — OXYCODONE-ACETAMINOPHEN 5-325 MG PO TABS
1.0000 | ORAL_TABLET | Freq: Once | ORAL | Status: AC
  Administered 2017-02-14: 1 via ORAL
  Filled 2017-02-14: qty 1

## 2017-02-14 MED ORDER — HYDROCODONE-ACETAMINOPHEN 5-325 MG PO TABS: 1.0000 | ORAL_TABLET | ORAL | 0 refills | Status: DC | PRN

## 2017-02-14 NOTE — Discharge Instructions (Signed)
Take the prescribed medication as directed.  Try to stay off your knee.  Ice and elevate at home. Follow-up with Dr. Everardo PacificVarkey-- call for appt on Monday morning. Return to the ED for new or worsening symptoms.
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
## 2017-02-18 ENCOUNTER — Encounter (HOSPITAL_BASED_OUTPATIENT_CLINIC_OR_DEPARTMENT_OTHER): Payer: Self-pay | Admitting: *Deleted

## 2017-02-18 DIAGNOSIS — S82034A Nondisplaced transverse fracture of right patella, initial encounter for closed fracture: Secondary | ICD-10-CM | POA: Diagnosis not present

## 2017-02-18 DIAGNOSIS — S82001D Unspecified fracture of right patella, subsequent encounter for closed fracture with routine healing: Secondary | ICD-10-CM | POA: Diagnosis not present

## 2017-02-19 DIAGNOSIS — S82001D Unspecified fracture of right patella, subsequent encounter for closed fracture with routine healing: Secondary | ICD-10-CM

## 2017-02-19 NOTE — H&P (Signed)
PREOPERATIVE H&P  Chief Complaint: RIGHT PATELLA FRACTURE  HPI: Juan Malone is a 56 y.o. male who presents for preoperative history and physical with a diagnosis of RIGHT PATELLA FRACTURE. Symptoms are rated as moderate to severe, and have been worsening.  This is significantly impairing activities of daily living.  He has elected for surgical management.   Past Medical History:  Diagnosis Date  . Asthma   . PONV (postoperative nausea and vomiting)    Past Surgical History:  Procedure Laterality Date  . HAND SURGERY    . SHOULDER SURGERY     Social History   Socioeconomic History  . Marital status: Single    Spouse name: None  . Number of children: None  . Years of education: None  . Highest education level: None  Social Needs  . Financial resource strain: None  . Food insecurity - worry: None  . Food insecurity - inability: None  . Transportation needs - medical: None  . Transportation needs - non-medical: None  Occupational History  . None  Tobacco Use  . Smoking status: Current Every Day Smoker    Years: 5.00    Types: Cigarettes  . Smokeless tobacco: Never Used  Substance and Sexual Activity  . Alcohol use: Yes    Comment: occassionally  . Drug use: No  . Sexual activity: None  Other Topics Concern  . None  Social History Narrative   ** Merged History Encounter **       Family History  Family history unknown: Yes   Allergies  Allergen Reactions  . Penicillins Anaphylaxis    Has patient had a PCN reaction causing immediate rash, facial/tongue/throat swelling, SOB or lightheadedness with hypotension: yes Has patient had a PCN reaction causing severe rash involving mucus membranes or skin necrosis: no Has patient had a PCN reaction that required hospitalization: yes Has patient had a PCN reaction occurring within the last 10 years: no If all of the above answers are "NO", then may proceed with Cephalosporin use.    Prior to Admission medications    Medication Sig Start Date End Date Taking? Authorizing Provider  acetaminophen (TYLENOL) 500 MG tablet Take 500 mg by mouth every 6 (six) hours as needed for mild pain, moderate pain or headache.   Yes [provider]  HYDROcodone-acetaminophen (NORCO/VICODIN) 5-325 MG tablet Take 1 tablet by mouth every 4 (four) hours as needed. 02/14/17  Yes Garlon HatchetSanders, Lisa M, PA-C  Ibuprofen (MOTRIN PO) Take by mouth.   Yes [provider]     Positive ROS: All other systems have been reviewed and were otherwise negative with the exception of those mentioned in the HPI and as above.  Physical Exam: General: Alert, no acute distress Cardiovascular: No pedal edema Respiratory: No cyanosis, no use of accessory musculature GI: No organomegaly, abdomen is soft and non-tender Skin: No lesions in the area of chief complaint Neurologic: Sensation intact distally Psychiatric: Patient is competent for consent with normal mood and affect Lymphatic: No axillary or cervical lymphadenopathy  MUSCULOSKELETAL: RLE: no active extension mechanism, distal motor and sensory intact  Assessment: RIGHT PATELLA FRACTURE  Plan: Plan for Procedure(s): OPEN REDUCTION INTERNAL (ORIF) FIXATION PATELLA  The risks benefits and alternatives were discussed with the patient including but not limited to the risks of nonoperative treatment, versus surgical intervention including infection, bleeding, nerve injury,  blood clots, cardiopulmonary complications, morbidity, mortality, among others, and they were willing to proceed.   Bjorn Pippinax T Mayanna Garlitz, MD  02/19/2017 8:28 AM

## 2017-02-23 ENCOUNTER — Ambulatory Visit (HOSPITAL_COMMUNITY): Payer: Medicaid Other

## 2017-02-23 ENCOUNTER — Ambulatory Visit (HOSPITAL_BASED_OUTPATIENT_CLINIC_OR_DEPARTMENT_OTHER): Payer: Medicaid Other | Admitting: Anesthesiology

## 2017-02-23 ENCOUNTER — Ambulatory Visit (HOSPITAL_BASED_OUTPATIENT_CLINIC_OR_DEPARTMENT_OTHER)
Admission: RE | Admit: 2017-02-23 | Discharge: 2017-02-23 | Disposition: A | Discharge status: Home / Self Care | Payer: Medicaid Other | Source: Ambulatory Visit | Attending: Orthopaedic Surgery | Admitting: Orthopaedic Surgery

## 2017-02-23 ENCOUNTER — Encounter (HOSPITAL_BASED_OUTPATIENT_CLINIC_OR_DEPARTMENT_OTHER)
Admission: RE | Disposition: A | Discharge status: Home / Self Care | Payer: Self-pay | Source: Ambulatory Visit | Attending: Orthopaedic Surgery

## 2017-02-23 ENCOUNTER — Other Ambulatory Visit: Payer: Self-pay

## 2017-02-23 ENCOUNTER — Encounter (HOSPITAL_BASED_OUTPATIENT_CLINIC_OR_DEPARTMENT_OTHER): Payer: Self-pay | Admitting: *Deleted

## 2017-02-23 DIAGNOSIS — S83421A Sprain of lateral collateral ligament of right knee, initial encounter: Secondary | ICD-10-CM | POA: Diagnosis not present

## 2017-02-23 DIAGNOSIS — Z79891 Long term (current) use of opiate analgesic: Secondary | ICD-10-CM | POA: Diagnosis not present

## 2017-02-23 DIAGNOSIS — Z880 Allergy status to penicillin: Secondary | ICD-10-CM | POA: Diagnosis not present

## 2017-02-23 DIAGNOSIS — Z9889 Other specified postprocedural states: Secondary | ICD-10-CM | POA: Insufficient documentation

## 2017-02-23 DIAGNOSIS — S83411A Sprain of medial collateral ligament of right knee, initial encounter: Secondary | ICD-10-CM | POA: Diagnosis not present

## 2017-02-23 DIAGNOSIS — Z87892 Personal history of anaphylaxis: Secondary | ICD-10-CM | POA: Diagnosis not present

## 2017-02-23 DIAGNOSIS — S82001D Unspecified fracture of right patella, subsequent encounter for closed fracture with routine healing: Secondary | ICD-10-CM

## 2017-02-23 DIAGNOSIS — S82001A Unspecified fracture of right patella, initial encounter for closed fracture: Secondary | ICD-10-CM | POA: Diagnosis present

## 2017-02-23 DIAGNOSIS — F17210 Nicotine dependence, cigarettes, uncomplicated: Secondary | ICD-10-CM | POA: Diagnosis not present

## 2017-02-23 DIAGNOSIS — W19XXXA Unspecified fall, initial encounter: Secondary | ICD-10-CM | POA: Diagnosis not present

## 2017-02-23 DIAGNOSIS — Z419 Encounter for procedure for purposes other than remedying health state, unspecified: Secondary | ICD-10-CM

## 2017-02-23 DIAGNOSIS — S82041A Displaced comminuted fracture of right patella, initial encounter for closed fracture: Secondary | ICD-10-CM | POA: Insufficient documentation

## 2017-02-23 HISTORY — DX: Nausea with vomiting, unspecified: R11.2

## 2017-02-23 HISTORY — PX: ORIF PATELLA: SHX5033

## 2017-02-23 HISTORY — DX: Other specified postprocedural states: Z98.890

## 2017-02-23 SURGERY — OPEN REDUCTION INTERNAL FIXATION (ORIF) PATELLA
Anesthesia: General | Site: Knee | Laterality: Right

## 2017-02-23 MED ORDER — ONDANSETRON HCL 4 MG/2ML IJ SOLN
INTRAMUSCULAR | Status: DC | PRN
  Administered 2017-02-23: 4 mg via INTRAVENOUS

## 2017-02-23 MED ORDER — LIDOCAINE 2% (20 MG/ML) 5 ML SYRINGE
INTRAMUSCULAR | Status: DC | PRN
  Administered 2017-02-23: 60 mg via INTRAVENOUS

## 2017-02-23 MED ORDER — PROPOFOL 10 MG/ML IV BOLUS
INTRAVENOUS | Status: DC | PRN
  Administered 2017-02-23: 200 mg via INTRAVENOUS

## 2017-02-23 MED ORDER — DEXAMETHASONE SODIUM PHOSPHATE 10 MG/ML IJ SOLN
INTRAMUSCULAR | Status: DC | PRN
  Administered 2017-02-23: 10 mg via INTRAVENOUS

## 2017-02-23 MED ORDER — EPHEDRINE SULFATE 50 MG/ML IJ SOLN
INTRAMUSCULAR | Status: DC | PRN
  Administered 2017-02-23: 10 mg via INTRAVENOUS

## 2017-02-23 MED ORDER — CLINDAMYCIN PHOSPHATE 900 MG/50ML IV SOLN
900.0000 mg | INTRAVENOUS | Status: AC
  Administered 2017-02-23: 900 mg via INTRAVENOUS

## 2017-02-23 MED ORDER — CHLORHEXIDINE GLUCONATE 4 % EX LIQD: 60.0000 mL | Freq: Once | CUTANEOUS | Status: DC

## 2017-02-23 MED ORDER — FENTANYL CITRATE (PF) 100 MCG/2ML IJ SOLN
INTRAMUSCULAR | Status: AC
  Filled 2017-02-23: qty 2

## 2017-02-23 MED ORDER — MIDAZOLAM HCL 2 MG/2ML IJ SOLN
INTRAMUSCULAR | Status: AC
  Filled 2017-02-23: qty 2

## 2017-02-23 MED ORDER — ONDANSETRON HCL 4 MG PO TABS: 4.0000 mg | ORAL_TABLET | Freq: Three times a day (TID) | ORAL | 1 refills | Status: AC | PRN

## 2017-02-23 MED ORDER — FENTANYL CITRATE (PF) 100 MCG/2ML IJ SOLN
50.0000 ug | INTRAMUSCULAR | Status: AC | PRN
  Administered 2017-02-23: 100 ug via INTRAVENOUS
  Administered 2017-02-23 (×4): 25 ug via INTRAVENOUS

## 2017-02-23 MED ORDER — DEXAMETHASONE SODIUM PHOSPHATE 10 MG/ML IJ SOLN
INTRAMUSCULAR | Status: AC
  Filled 2017-02-23: qty 1

## 2017-02-23 MED ORDER — MELOXICAM 7.5 MG PO TABS: 7.5000 mg | ORAL_TABLET | Freq: Every day | ORAL | 2 refills | Status: DC

## 2017-02-23 MED ORDER — SCOPOLAMINE 1 MG/3DAYS TD PT72: 1.0000 | MEDICATED_PATCH | Freq: Once | TRANSDERMAL | Status: DC | PRN

## 2017-02-23 MED ORDER — OXYCODONE HCL 5 MG PO TABS: ORAL_TABLET | ORAL | 0 refills | Status: AC

## 2017-02-23 MED ORDER — CLINDAMYCIN PHOSPHATE 900 MG/50ML IV SOLN
INTRAVENOUS | Status: AC
  Filled 2017-02-23: qty 50

## 2017-02-23 MED ORDER — LACTATED RINGERS IV SOLN
INTRAVENOUS | Status: DC
  Administered 2017-02-23 (×2): via INTRAVENOUS

## 2017-02-23 MED ORDER — ONDANSETRON HCL 4 MG/2ML IJ SOLN
INTRAMUSCULAR | Status: AC
  Filled 2017-02-23: qty 2

## 2017-02-23 MED ORDER — PROPOFOL 500 MG/50ML IV EMUL
INTRAVENOUS | Status: AC
  Filled 2017-02-23: qty 50

## 2017-02-23 MED ORDER — 0.9 % SODIUM CHLORIDE (POUR BTL) OPTIME
TOPICAL | Status: DC | PRN
  Administered 2017-02-23: 200 mL

## 2017-02-23 MED ORDER — LIDOCAINE 2% (20 MG/ML) 5 ML SYRINGE
INTRAMUSCULAR | Status: AC
  Filled 2017-02-23: qty 5

## 2017-02-23 MED ORDER — BUPIVACAINE-EPINEPHRINE (PF) 0.5% -1:200000 IJ SOLN
INTRAMUSCULAR | Status: DC | PRN
  Administered 2017-02-23: 30 mL via PERINEURAL

## 2017-02-23 MED ORDER — VANCOMYCIN HCL 1000 MG IV SOLR
INTRAVENOUS | Status: AC
  Filled 2017-02-23: qty 1000

## 2017-02-23 MED ORDER — ASPIRIN 81 MG PO TABS: 81.0000 mg | ORAL_TABLET | Freq: Every day | ORAL | 0 refills | Status: AC

## 2017-02-23 MED ORDER — MIDAZOLAM HCL 2 MG/2ML IJ SOLN
1.0000 mg | INTRAMUSCULAR | Status: DC | PRN
  Administered 2017-02-23: 2 mg via INTRAVENOUS

## 2017-02-23 MED ORDER — ACETAMINOPHEN 500 MG PO TABS: 1000.0000 mg | ORAL_TABLET | Freq: Three times a day (TID) | ORAL | 0 refills | Status: AC

## 2017-02-23 MED ORDER — BISACODYL 5 MG PO TBEC: 5.0000 mg | DELAYED_RELEASE_TABLET | Freq: Every day | ORAL | 0 refills | Status: AC | PRN

## 2017-02-23 MED ORDER — VANCOMYCIN HCL 1000 MG IV SOLR
INTRAVENOUS | Status: DC | PRN
  Administered 2017-02-23: 1000 mg via TOPICAL

## 2017-02-23 SURGICAL SUPPLY — 92 items
4.0mm Cortical Screws ×3 IMPLANT
4.0mm Cortical screw ×9 IMPLANT
ARTHREX K-WIRES ×6 IMPLANT
BANDAGE ACE 4X5 VEL STRL LF (GAUZE/BANDAGES/DRESSINGS) IMPLANT
BANDAGE ACE 6X5 VEL STRL LF (GAUZE/BANDAGES/DRESSINGS) ×3 IMPLANT
BANDAGE ESMARK 6X9 LF (GAUZE/BANDAGES/DRESSINGS) ×1 IMPLANT
BENZOIN TINCTURE PRP APPL 2/3 (GAUZE/BANDAGES/DRESSINGS) ×3 IMPLANT
BIT DRILL 2.6 CANN (BIT) ×3 IMPLANT
BLADE HEX COATED 2.75 (ELECTRODE) ×3 IMPLANT
BLADE SURG 10 STRL SS (BLADE) ×3 IMPLANT
BLADE SURG 15 STRL LF DISP TIS (BLADE) ×2 IMPLANT
BLADE SURG 15 STRL SS (BLADE) ×4
BNDG COHESIVE 4X5 TAN STRL (GAUZE/BANDAGES/DRESSINGS) IMPLANT
BNDG ESMARK 6X9 LF (GAUZE/BANDAGES/DRESSINGS) ×3
CANISTER SUCT 1200ML W/VALVE (MISCELLANEOUS) IMPLANT
CHLORAPREP W/TINT 26ML (MISCELLANEOUS) ×3 IMPLANT
CLOSURE WOUND 1/2 X4 (GAUZE/BANDAGES/DRESSINGS) ×1
CUFF TOURNIQUET SINGLE 34IN LL (TOURNIQUET CUFF) ×3 IMPLANT
DECANTER SPIKE VIAL GLASS SM (MISCELLANEOUS) IMPLANT
DRAPE C-ARM 42X72 X-RAY (DRAPES) ×3 IMPLANT
DRAPE C-ARMOR (DRAPES) ×3 IMPLANT
DRAPE EXTREMITY T 121X128X90 (DRAPE) ×3 IMPLANT
DRAPE IMP U-DRAPE 54X76 (DRAPES) IMPLANT
DRAPE INCISE IOBAN 66X45 STRL (DRAPES) IMPLANT
DRAPE OEC MINIVIEW 54X84 (DRAPES) IMPLANT
DRAPE SWITCH (DRAPES) IMPLANT
DRAPE U-SHAPE 47X51 STRL (DRAPES) ×3 IMPLANT
DRSG MEPILEX BORDER 4X8 (GAUZE/BANDAGES/DRESSINGS) ×3 IMPLANT
DRSG PAD ABDOMINAL 8X10 ST (GAUZE/BANDAGES/DRESSINGS) IMPLANT
ELECT NEEDLE TIP 2.8 STRL (NEEDLE) IMPLANT
ELECT REM PT RETURN 9FT ADLT (ELECTROSURGICAL) ×3
ELECTRODE REM PT RTRN 9FT ADLT (ELECTROSURGICAL) ×1 IMPLANT
FIBERTAPE 2 W/STRL NDL 17 (SUTURE) ×6 IMPLANT
GAUZE SPONGE 4X4 12PLY STRL (GAUZE/BANDAGES/DRESSINGS) ×3 IMPLANT
GAUZE XEROFORM 1X8 LF (GAUZE/BANDAGES/DRESSINGS) IMPLANT
GLOVE BIOGEL PI IND STRL 7.0 (GLOVE) ×2 IMPLANT
GLOVE BIOGEL PI IND STRL 8 (GLOVE) ×1 IMPLANT
GLOVE BIOGEL PI INDICATOR 7.0 (GLOVE) ×4
GLOVE BIOGEL PI INDICATOR 8 (GLOVE) ×2
GLOVE ECLIPSE 6.5 STRL STRAW (GLOVE) ×6 IMPLANT
GLOVE ECLIPSE 8.0 STRL XLNG CF (GLOVE) ×6 IMPLANT
GOWN STRL REUS W/ TWL LRG LVL3 (GOWN DISPOSABLE) ×1 IMPLANT
GOWN STRL REUS W/TWL LRG LVL3 (GOWN DISPOSABLE) ×2
GOWN STRL REUS W/TWL XL LVL3 (GOWN DISPOSABLE) ×3 IMPLANT
IMMOBILIZER KNEE 22 UNIV (SOFTGOODS) IMPLANT
IMMOBILIZER KNEE 24 THIGH 36 (MISCELLANEOUS) IMPLANT
IMMOBILIZER KNEE 24 UNIV (MISCELLANEOUS)
KIT LEG STABILIZATION (KITS) IMPLANT
KNEE WRAP E Z 3 GEL PACK (MISCELLANEOUS) ×3 IMPLANT
MANIFOLD NEPTUNE II (INSTRUMENTS) ×3 IMPLANT
NEEDLE HYPO 22GX1.5 SAFETY (NEEDLE) IMPLANT
NEEDLE KEITH (NEEDLE) IMPLANT
NEEDLE MAYO 6 CRC TAPER PT (NEEDLE) ×3 IMPLANT
NS IRRIG 1000ML POUR BTL (IV SOLUTION) ×3 IMPLANT
PACK ARTHROSCOPY DSU (CUSTOM PROCEDURE TRAY) ×3 IMPLANT
PACK BASIN DAY SURGERY FS (CUSTOM PROCEDURE TRAY) ×3 IMPLANT
PAD CAST 4YDX4 CTTN HI CHSV (CAST SUPPLIES) IMPLANT
PADDING CAST COTTON 4X4 STRL (CAST SUPPLIES)
PADDING CAST COTTON 6X4 STRL (CAST SUPPLIES) ×3 IMPLANT
PENCIL BUTTON HOLSTER BLD 10FT (ELECTRODE) ×3 IMPLANT
SLEEVE SCD COMPRESS KNEE MED (MISCELLANEOUS) ×3 IMPLANT
SPLINT FAST PLASTER 5X30 (CAST SUPPLIES)
SPLINT PLASTER CAST FAST 5X30 (CAST SUPPLIES) IMPLANT
SPONGE LAP 18X18 X RAY DECT (DISPOSABLE) ×3 IMPLANT
SPONGE LAP 4X18 X RAY DECT (DISPOSABLE) IMPLANT
STAPLER VISISTAT (STAPLE) IMPLANT
STRIP CLOSURE SKIN 1/2X4 (GAUZE/BANDAGES/DRESSINGS) ×2 IMPLANT
SUCTION FRAZIER HANDLE 10FR (MISCELLANEOUS) ×2
SUCTION TUBE FRAZIER 10FR DISP (MISCELLANEOUS) ×1 IMPLANT
SUT ETHILON 3 0 PS 1 (SUTURE) IMPLANT
SUT FIBERWIRE #2 38 T-5 BLUE (SUTURE) ×3
SUT FIBERWIRE #5 38 CONV NDL (SUTURE)
SUT MNCRL AB 3-0 PS2 18 (SUTURE) IMPLANT
SUT MNCRL AB 4-0 PS2 18 (SUTURE) ×3 IMPLANT
SUT VIC AB 0 CT1 18XCR BRD 8 (SUTURE) ×2 IMPLANT
SUT VIC AB 0 CT1 8-18 (SUTURE) ×4
SUT VIC AB 1 CT1 27 (SUTURE)
SUT VIC AB 1 CT1 27XBRD ANBCTR (SUTURE) IMPLANT
SUT VIC AB 2-0 CT1 27 (SUTURE)
SUT VIC AB 2-0 CT1 TAPERPNT 27 (SUTURE) IMPLANT
SUT VIC AB 2-0 SH 27 (SUTURE)
SUT VIC AB 2-0 SH 27XBRD (SUTURE) IMPLANT
SUT VIC AB 3-0 SH 27 (SUTURE) ×4
SUT VIC AB 3-0 SH 27X BRD (SUTURE) ×2 IMPLANT
SUTURE FIBERWR #2 38 T-5 BLUE (SUTURE) ×1 IMPLANT
SUTURE FIBERWR #5 38 CONV NDL (SUTURE) IMPLANT
SYR BULB 3OZ (MISCELLANEOUS) IMPLANT
SYR CONTROL 10ML LL (SYRINGE) IMPLANT
TOWEL OR 17X24 6PK STRL BLUE (TOWEL DISPOSABLE) ×9 IMPLANT
TOWEL OR NON WOVEN STRL DISP B (DISPOSABLE) ×3 IMPLANT
UNDERPAD 30X30 (UNDERPADS AND DIAPERS) IMPLANT
YANKAUER SUCT BULB TIP NO VENT (SUCTIONS) ×3 IMPLANT

## 2017-02-23 NOTE — Discharge Instructions (Signed)

## 2017-02-23 NOTE — Anesthesia Postprocedure Evaluation (Signed)
Anesthesia Post Note  Patient: Juan Malone  Procedure(s) Performed: OPEN REDUCTION INTERNAL (ORIF) FIXATION PATELLA (Right Knee)     Patient location during evaluation: PACU Anesthesia Type: General Level of consciousness: awake and alert Pain management: pain level controlled Vital Signs Assessment: post-procedure vital signs reviewed and stable Respiratory status: spontaneous breathing, nonlabored ventilation, respiratory function stable and patient connected to nasal cannula oxygen Cardiovascular status: blood pressure returned to baseline and stable Postop Assessment: no apparent nausea or vomiting Anesthetic complications: no    Last Vitals:  Vitals:   02/23/17 1015 02/23/17 1042  BP: (!) 141/96 (!) 153/98  Pulse: 71 76  Resp: 17 18  Temp:  37.1 C  SpO2: 100% 99%    Last Pain:  Vitals:   02/23/17 1042  TempSrc: Oral  PainSc: 0-No pain                 Kennieth RadFitzgerald, Dynisha Due E

## 2017-02-23 NOTE — Progress Notes (Signed)
Assisted Dr. Rob Fitzgerald with right, ultrasound guided, femoral block. Side rails up, monitors on throughout procedure. See vital signs in flow sheet. Tolerated Procedure well. 

## 2017-02-23 NOTE — Anesthesia Procedure Notes (Signed)
Anesthesia Regional Block: Femoral nerve block   Pre-Anesthetic Checklist: ,, timeout performed, Correct Patient, Correct Site, Correct Laterality, Correct Procedure,, site marked, risks and benefits discussed, Surgical consent,  Pre-op evaluation,  At surgeon's request and post-op pain management  Laterality: Right  Prep: chloraprep       Needles:  Injection technique: Single-shot  Needle Type: Echogenic Stimulator Needle     Needle Length: 9cm  Needle Gauge: 21     Additional Needles:   Procedures:, nerve stimulator,,,, intact distal pulses,,,   Nerve Stimulator or Paresthesia:  Response: Quadriceps muscle contraction, 0.45 mA,   Additional Responses:   Narrative:  Start time: 02/23/2017 7:20 AM End time: 02/23/2017 7:26 AM Injection made incrementally with aspirations every 5 mL.  Performed by: Personally  Anesthesiologist: Achille RichHodierne, Neriah Brott, MD  Additional Notes: Functioning IV was confirmed and monitors were applied.  A 90mm 21ga Arrow echogenic stimulator needle was used. Sterile prep and drape,hand hygiene and sterile gloves were used.  Negative aspiration and negative test dose prior to incremental administration of local anesthetic. The patient tolerated the procedure well.

## 2017-02-23 NOTE — Anesthesia Preprocedure Evaluation (Signed)
Anesthesia Evaluation  Patient identified by MRN, date of birth, ID band Patient awake    Reviewed: Allergy & Precautions, H&P , NPO status , Patient's Chart, lab work & pertinent test results  History of Anesthesia Complications (+) PONV and history of anesthetic complications  Airway Mallampati: II   Neck ROM: full    Dental   Pulmonary asthma , Current Smoker,    breath sounds clear to auscultation       Cardiovascular negative cardio ROS   Rhythm:regular Rate:Normal     Neuro/Psych    GI/Hepatic   Endo/Other    Renal/GU      Musculoskeletal   Abdominal   Peds  Hematology   Anesthesia Other Findings   Reproductive/Obstetrics                             Anesthesia Physical Anesthesia Plan  ASA: II  Anesthesia Plan: General   Post-op Pain Management:  Regional for Post-op pain   Induction: Intravenous  PONV Risk Score and Plan: 2 and Treatment may vary due to age or medical condition  Airway Management Planned: LMA  Additional Equipment:   Intra-op Plan:   Post-operative Plan:   Informed Consent: I have reviewed the patients History and Physical, chart, labs and discussed the procedure including the risks, benefits and alternatives for the proposed anesthesia with the patient or authorized representative who has indicated his/her understanding and acceptance.     Plan Discussed with: CRNA, Anesthesiologist and Surgeon  Anesthesia Plan Comments:         Anesthesia Quick Evaluation

## 2017-02-23 NOTE — Anesthesia Procedure Notes (Signed)
Procedure Name: LMA Insertion Date/Time: 02/23/2017 8:06 AM Performed by: Burna Cashonrad, Aniqua Briere C, CRNA Pre-anesthesia Checklist: Patient identified, Emergency Drugs available, Suction available and Patient being monitored Patient Re-evaluated:Patient Re-evaluated prior to induction Oxygen Delivery Method: Circle system utilized Preoxygenation: Pre-oxygenation with 100% oxygen Induction Type: IV induction Ventilation: Mask ventilation without difficulty LMA: LMA inserted LMA Size: 5.0 Number of attempts: 1 Airway Equipment and Method: Bite block Placement Confirmation: positive ETCO2 Tube secured with: Tape Dental Injury: Teeth and Oropharynx as per pre-operative assessment

## 2017-02-23 NOTE — Transfer of Care (Signed)
Immediate Anesthesia Transfer of Care Note  Patient: Juan Malone  Procedure(s) Performed: OPEN REDUCTION INTERNAL (ORIF) FIXATION PATELLA (Right Knee)  Patient Location: PACU  Anesthesia Type:GA combined with regional for post-op pain  Level of Consciousness: sedated  Airway & Oxygen Therapy: Patient Spontanous Breathing and Patient connected to face mask oxygen  Post-op Assessment: Report given to RN and Post -op Vital signs reviewed and stable  Post vital signs: Reviewed and stable  Last Vitals:  Vitals:   02/23/17 0725 02/23/17 0945  BP: 138/90 113/86  Pulse: 65 67  Resp: 13   Temp:  (!) (P) 36.4 C  SpO2: 100% 100%    Last Pain:  Vitals:   02/23/17 0635  TempSrc: Oral  PainSc: 8          Complications: No apparent anesthesia complications

## 2017-02-23 NOTE — Interval H&P Note (Signed)
Went through procedure again with family and patient.  Specifically discussed his limitations on obtaining PT likely to lead to stiffness but I still feel operative management is superior to non-op in this active patient.  All questions answered.   History and Physical Interval Note:  02/23/2017 7:41 AM  Juan Malone  has presented today for surgery, with the diagnosis of RIGHT PATELLA FRACTURE  The various methods of treatment have been discussed with the patient and family. After consideration of risks, benefits and other options for treatment, the patient has consented to  Procedure(s): OPEN REDUCTION INTERNAL (ORIF) FIXATION PATELLA (Right) as a surgical intervention .  The patient's history has been reviewed, patient examined, no change in status, stable for surgery.  I have reviewed the patient's chart and labs.  Questions were answered to the patient's satisfaction.     Bjorn Pippinax T Varkey

## 2017-02-23 NOTE — Op Note (Signed)
Orthopaedic Surgery Operative Note (CSN: [id number])  Juan Malone  1960-08-15 Date of Surgery: 02/23/2017   Diagnoses:  RIGHT PATELLA FRACTURE  Procedure: Patella ORIF [id number] Repair of torn capsule medial AND lateral 27405 x2   Operative Finding Successful completion of planned procedure.  Significant comminution laterally.  Anatomic reduction of patella articular surface.  Stable to 30 deg flexion in OR.  Absorbable sutures.  Post-operative plan: The patient will be WBAT in hinged brace locked in extension.  The patient will be discharegd home.  DVT prophylaxis ASA 81mg  qd.  Pain control with PRN pain medication preferring oral medicines.  Follow up plan will be scheduled in approximately 10-14 days for wound check and start of PT unlocked 0-30 for ROM exercises.  XR at visit AP/lateral  Post-Op Diagnosis: Same Surgeons:Primary: Bjorn PippinVarkey, Hennessy Bartel T, MD Assistants:none Location: [facility] OR ROOM 5 Anesthesia: General Antibiotics: Ancef 2g preop, Vancomycin 1g locally Tourniquet time:  Total Tourniquet Time Documented: Thigh (Right) - 75 minutes Total: Thigh (Right) - 75 minutes  Estimated Blood Loss: 20ml Complications: None Specimens: None Implants: Implant Name Type Inv. Item Serial No. Manufacturer Lot No. LRB No. Used Action  4.740mm Cortical screw    [employer] STERILZIED ON SET Right 2 Implanted    Indications for Surgery:   Juan Malone is a 56 y.o. male with fall resulting in displaced comminuted patella fracture.  Benefits and risks of operative and nonoperative management were discussed prior to surgery with patient/guardian(s) and informed consent form was completed.  Specific risks including infection, need for additional surgery, stiffness due to lack of access to PT, hardware pain or failure   Procedure:   The patient was identified in the preoperative holding area where the surgical site was marked. The patient was taken to the OR where a procedural timeout was  called and the above noted anesthesia was induced.  The patient was positioned supine with bump on ipsilateral hip.  Preoperative antibiotics were dosed.  The patient's right leg was prepped and draped in the usual sterile fashion.  A second preoperative timeout was called.      A tourniquet was used for the above listed time.  We made a medial parapatellar incision dissecting sharply through the skin overlying the patella.  We encountered the fascia overlying patella and the retinaculum and immediately noted fracture as well as tears in the medial lateral retinaculum and obvious hematoma.  Carried our dissection subcutaneously flaps exposing the retinacular tears achieving hemostasis as we progressed.  Fracture fragments are noted to have some comminution laterally more than medially.  Primarily to large fracture fragments.    We were able to excisionally debride hematoma from the fracture site and clear soft tissue to this facilitate and reduction.  At this point a combination of Weber clamps as well as point-to-point reduction clamps were used to hold anatomic reduction which was checked on fluoroscopy.  We prioritized the articular reduction.  We were able to palpate the articular surface to verify that it was indeed anatomically reduced through the retinacular tears.  This point we used fluoroscopy to place 2 K wires from the [employer] patella fracture set checking position on orthogonal fluoroscopy.  Were happy with her position and proceeded to place 2 partially threaded screws that were appropriately measured.  We again checked our reduction and screw length and placement using fluoroscopy.  Were happy with our compression at the fracture site.  There was some superficial anterior comminution but the articular surface itself was  anatomic.  This point we used fiber tape and placed in a tension band fashion through the screws and tied this down with good tension on her sutures.  We then placed a  cerclage suture of fiber tape around the outside of the patella itself and again tied this down.   At this point the incision was thoroughly irrigated and 1 g of vancomycin powder was placed locally before the retinacular repair was performed with multiple #1 Vicryl pops as well as #2 FiberWire.  Any rents in the tendon were closed with Vicryl from placement of screws.    The incision was thoroughly irrigated and closed in a multilayer fashion.  A sterile dressing was placed.   The patient was awoken from general anesthesia and taken to the PACU in stable condition without complication after a hinged brace was fitted.

## 2017-02-24 ENCOUNTER — Encounter (HOSPITAL_BASED_OUTPATIENT_CLINIC_OR_DEPARTMENT_OTHER): Payer: Self-pay | Admitting: Orthopaedic Surgery

## 2017-03-04 DIAGNOSIS — S82034A Nondisplaced transverse fracture of right patella, initial encounter for closed fracture: Secondary | ICD-10-CM | POA: Diagnosis not present

## 2017-03-04 DIAGNOSIS — S82001D Unspecified fracture of right patella, subsequent encounter for closed fracture with routine healing: Secondary | ICD-10-CM | POA: Diagnosis not present

## 2017-03-09 ENCOUNTER — Ambulatory Visit: Payer: Medicaid Other | Attending: Orthopaedic Surgery | Admitting: Physical Therapy

## 2017-03-09 ENCOUNTER — Encounter: Payer: Self-pay | Admitting: Physical Therapy

## 2017-03-09 DIAGNOSIS — M6281 Muscle weakness (generalized): Secondary | ICD-10-CM | POA: Diagnosis present

## 2017-03-09 DIAGNOSIS — M25561 Pain in right knee: Secondary | ICD-10-CM | POA: Insufficient documentation

## 2017-03-09 DIAGNOSIS — M25661 Stiffness of right knee, not elsewhere classified: Secondary | ICD-10-CM | POA: Insufficient documentation

## 2017-03-09 DIAGNOSIS — R600 Localized edema: Secondary | ICD-10-CM

## 2017-03-09 DIAGNOSIS — R2689 Other abnormalities of gait and mobility: Secondary | ICD-10-CM | POA: Diagnosis present

## 2017-03-09 DIAGNOSIS — G8929 Other chronic pain: Secondary | ICD-10-CM | POA: Insufficient documentation

## 2017-03-09 NOTE — Therapy (Addendum)
[facility] [address] [place], [place], [address] Phone: [phone]   Fax:  [phone]  Physical Therapy Evaluation  Patient Details  Name: Juan Malone MRN: [id number] Date of Birth: 22-Feb-1961 Referring Provider: Ramond Marrowax Varkey MD   Encounter Date: 03/09/2017  PT End of Session - 03/09/17 1158    Visit Number  1    Number of Visits  17    Date for PT Re-Evaluation  05/04/17    Authorization Type  Self pay (awating disability)    PT Start Time  1146    PT Stop Time  1234    PT Time Calculation (min)  48 min    Activity Tolerance  Patient tolerated treatment well       Past Medical History:  Diagnosis Date  . Asthma   . PONV (postoperative nausea and vomiting)     Past Surgical History:  Procedure Laterality Date  . HAND SURGERY    . ORIF PATELLA Right 02/23/2017   Procedure: OPEN REDUCTION INTERNAL (ORIF) FIXATION PATELLA;  Surgeon: Bjorn PippinVarkey, Dax T, MD;  Location: [facility];  Service: Orthopedics;  Laterality: Right;  . SHOULDER SURGERY      There were no vitals filed for this visit.   Subjective Assessment - 03/09/17 1157    Subjective  pt is a 56 y.o M s/p R patellar ORIF on 02/23/2017 due to a fall going down the steps at home. Since the surgery he reports having continued pain noted at about 4am. She currently used 1 crutch to ambulate. pain in the top of the hip on the R and down to the R shin.     Limitations  Standing;Lifting;Walking    How long can you sit comfortably?  5 min    How long can you stand comfortably?  10 min with 1 crutch    How long can you walk comfortably?  10 min with 1 crutch    Diagnostic tests  x-ray 03/04/2017    Patient Stated Goals  to be able walk again, return to work     Currently in Pain?  Yes    Pain Score  8  at worst 10/10. last took pain medication at 9am    Pain Location  Knee    Pain Orientation  Right    Pain Descriptors / Indicators   Sharp;Stabbing;Aching;Throbbing;Sore    Pain Type  Surgical pain    Pain Onset  1 to 4 weeks ago    Pain Frequency  Constant    Aggravating Factors   laying down, standing up or sitting down     Pain Relieving Factors  medication, ice pack,          [facility] PT Assessment - 03/09/17 1152      Assessment   Medical Diagnosis  s/p R patellar ORIF    Referring Provider  Ramond Marrowax Varkey MD    Onset Date/Surgical Date  02/23/17    Hand Dominance  Right    Next MD Visit  04/01/2017    Prior Therapy  no      Precautions   Precaution Comments  avoid falling, take time walking to avoid bending the knee.       Balance Screen   Has the patient fallen in the past 6 months  Yes    How many times?  1    Has the patient had a decrease in activity level because of a fear of falling?   Yes  Is the patient reluctant to leave their home because of a fear of falling?   Yes      Home Environment   Living Environment  Private residence    Living Arrangements  Spouse/significant other    Available Help at Discharge  Family    Type of Home  House    Home Access  Stairs to enter    Entrance Stairs-Number of Steps  2    Entrance Stairs-Rails  Can reach both    Home Layout  One level    Home Equipment  Crutches;Cane - single point      Prior Function   Level of Independence  Independent with basic ADLs    Vocation  Self employed hardwood flooring      Cognition   Overall Cognitive Status  Within Functional Limits for tasks assessed      Observation/Other Assessments   Observations  skin appears clean, free from infection and healing well with steri-strips    Focus on Therapeutic Outcomes (FOTO)   76% limited predicted 45% limited      Observation/Other Assessments-Edema    Edema  Circumferential      Circumferential Edema   Circumferential - Right  @ joint line37.4cm , 10 cm above 34.5 cm, 10 cm  below 30cm       Posture/Postural Control   Posture/Postural Control  Postural limitations       ROM / Strength   AROM / PROM / Strength  AROM;PROM;Strength      AROM   AROM Assessment Site  Knee    Right/Left Knee  Right;Left    Right Knee Extension  -10    Right Knee Flexion  44    Left Knee Extension  0    Left Knee Flexion  141      PROM   PROM Assessment Site  Knee    Right/Left Knee  Right;Left    Right Knee Extension  -6    Right Knee Flexion  47      Strength   Overall Strength Comments  R knee not tested due to restrictions    Strength Assessment Site  Knee    Right/Left Knee  Right;Left    Left Knee Flexion  4+/5    Left Knee Extension  4+/5      Palpation   Palpation comment  TTP along the medial/ lateral, patellar and quad tendon and general patella      Ambulation/Gait   Assistive device  L Axillary Crutch    Gait Pattern  Step-to pattern;Decreased stride length;Decreased stance time - right;Decreased step length - left;Trendelenburg;Antalgic;Lateral hip instability;Trunk flexed;Decreased trunk rotation             Objective measurements completed on examination: See above findings.              PT Education - 03/09/17 1230    Education provided  Yes    Education Details  evaluation findings, POC, goals, HEP with proper from/ rationale. RICE techniques and benefits    Person(s) Educated  Patient    Methods  Explanation;Verbal cues;Handout    Comprehension  Verbalized understanding;Verbal cues required       PT Short Term Goals - 03/09/17 1243      PT SHORT TERM GOAL #1   Title  pt to be I with initial HEP     Baseline  no previous HEp    Time  4    Period  Weeks    Status  New    Target Date  05/04/17      PT SHORT TERM GOAL #2   Title  pt to increase knee flexion to >/= 90 degrees to promote functional therapuetic progression    Baseline  knee flexion AROM 44 degrees    Time  4    Period  Weeks    Status  New    Target Date  04/06/17      PT SHORT TERM GOAL #3   Title  pt reduce R knee edema by >/= 1cm to promote knee  mobility and reduce pain     Baseline  @ joint line 37.4cm , 10 cm above 34.5 cm, 10 cm  below 30cm     Time  4    Period  Weeks    Status  New    Target Date  04/06/17        PT Long Term Goals - 03/09/17 1246      PT LONG TERM GOAL #1   Title  increaes knee mobility to >/= -2 to 120 degrees to promote functional and efficent gait pattern     Baseline  ext -10 and flexion 44    Time  8    Period  Weeks    Status  New    Target Date  05/04/17      PT LONG TERM GOAL #2   Title  pt to increase R LE strength to >/= 4/5 to promote hip/ knee stability for walking/ standing ADLs    Baseline  unable to test R knee due to limitation    Time  8    Period  Weeks    Status  New    Target Date  05/04/17      PT LONG TERM GOAL #3   Title  pt to be able to sit/ stand and walk for >/= 45 min with <=2/10 pain and no AD for functional endurnace required for ADLs     Baseline  walk/ stand 10 min with 1 crutchand sit for 5     Time  8    Period  Weeks    Status  New    Target Date  05/04/17      PT LONG TERM GOAL #4   Title  increase FOTO score to </= 45% limited to demo improvement in function    Baseline  inital FOTO 76% limited    Time  8    Period  Weeks    Status  New    Target Date  05/04/17             Plan - 03/09/17 1239    Clinical Impression Statement  pt presents to OPPT s/p R patellar ORIF on 02/23/2017 secondary to a fall down his stairs. limited R knee flexion/ extension with AROM/PROM due to pain and tighntess associated with post-op findings. TTP along medial/ lateral joint line and patellar/quad tendon. He currently uses 1 crutch for ambulation with straight leg brace. He would benefit from physical therapy to improve knee mobility/ strength, increase functional gait and return pt to PLOF by addressing the defictis listed.     Clinical Presentation  Stable    Clinical Decision Making  Low    Rehab Potential  Good    PT Frequency  2x / week    PT Duration  8  weeks    PT Treatment/Interventions  ADLs/Self Care Home Management;Electrical Stimulation;Iontophoresis 4mg /ml Dexamethasone;Moist Heat;Ultrasound;Gait training;Stair training;Therapeutic activities;Therapeutic exercise;Manual techniques;Taping;Passive range  of motion;Scar mobilization;Balance training;Neuromuscular re-education;Cryotherapy, Vasopnuematic compression.    PT Next Visit Plan  review and update HEP, knee ROM/ mobs, hip / ankle strengthening    PT Home Exercise Plan  standing weight shifting, quad set, glute set and heel slides,     Consulted and Agree with Plan of Care  Patient       Patient will benefit from skilled therapeutic intervention in order to improve the following deficits and impairments:  Pain, Abnormal gait, Decreased endurance, Decreased activity tolerance, Decreased balance, Decreased range of motion, Impaired flexibility, Difficulty walking, Decreased strength, Decreased scar mobility, Improper body mechanics, Increased edema  Visit Diagnosis: Chronic pain of right knee  Stiffness of right knee, not elsewhere classified  Localized edema  Other abnormalities of gait and mobility  Muscle weakness (generalized)     Problem List Patient Active Problem List   Diagnosis Date Noted  . Fracture of patella with routine healing, right, closed 02/19/2017   Lulu Riding PT, DPT, LAT, ATC  03/09/17  12:52 PM      [facility] [address] [place], [place], [address] Phone: [phone]   Fax:  [phone]  Name: Juan Malone MRN: [id number] Date of Birth: Dec 07, 1960

## 2017-03-17 ENCOUNTER — Ambulatory Visit: Payer: Medicaid Other | Admitting: Physical Therapy

## 2017-03-24 ENCOUNTER — Ambulatory Visit: Payer: Medicaid Other | Attending: Orthopaedic Surgery | Admitting: Physical Therapy

## 2017-03-24 DIAGNOSIS — G8929 Other chronic pain: Secondary | ICD-10-CM | POA: Diagnosis present

## 2017-03-24 DIAGNOSIS — R600 Localized edema: Secondary | ICD-10-CM | POA: Insufficient documentation

## 2017-03-24 DIAGNOSIS — R2689 Other abnormalities of gait and mobility: Secondary | ICD-10-CM | POA: Diagnosis present

## 2017-03-24 DIAGNOSIS — M6281 Muscle weakness (generalized): Secondary | ICD-10-CM | POA: Diagnosis present

## 2017-03-24 DIAGNOSIS — M25561 Pain in right knee: Secondary | ICD-10-CM | POA: Insufficient documentation

## 2017-03-24 DIAGNOSIS — M25661 Stiffness of right knee, not elsewhere classified: Secondary | ICD-10-CM | POA: Diagnosis present

## 2017-03-24 NOTE — Therapy (Signed)
[facility] [address] [place], [place], [address] Phone: [phone]   Fax:  [phone]  Physical Therapy Treatment  Patient Details  Name: Juan Malone MRN: [id number] Date of Birth: 01/16/1961 Referring Provider: Ramond Marrowax Varkey MD   Encounter Date: 03/24/2017  PT End of Session - 03/24/17 1109    Visit Number  2    Number of Visits  17    Date for PT Re-Evaluation  05/04/17    Authorization Type  Self pay (awating disability)    PT Start Time  1100    PT Stop Time  1138    PT Time Calculation (min)  38 min       Past Medical History:  Diagnosis Date  . Asthma   . PONV (postoperative nausea and vomiting)     Past Surgical History:  Procedure Laterality Date  . HAND SURGERY    . ORIF PATELLA Right 02/23/2017   Procedure: OPEN REDUCTION INTERNAL (ORIF) FIXATION PATELLA;  Surgeon: Bjorn PippinVarkey, Dax T, MD;  Location: [facility];  Service: Orthopedics;  Laterality: Right;  . SHOULDER SURGERY      There were no vitals filed for this visit.      [facility] Assessment - 03/24/17 0001      AROM   Right Knee Flexion  50                  [facility] Adult PT Treatment/Exercise - 03/24/17 0001      Ambulation/Gait   Assistive device  L Axillary Crutch    Gait Pattern  Step-through pattern    Ambulation Surface  Level    Gait Comments  encouraged knee flexion in available ROM from locked brace to 30 degrees       Knee/Hip Exercises: Standing   Hip Abduction  20 reps    Hip Extension  20 reps      Knee/Hip Exercises: Supine   Quad Sets  20 reps    Heel Slides  20 reps      Ankle Exercises: Supine   Other Supine Ankle Exercises  AROM all planes                PT Short Term Goals - 03/09/17 1243      PT SHORT TERM GOAL #1   Title  pt to be I with initial HEP     Baseline  no previous HEp    Time  4    Period  Weeks    Status  New    Target Date  05/04/17      PT SHORT TERM GOAL  #2   Title  pt to increase knee flexion to >/= 90 degrees to promote functional therapuetic progression    Baseline  knee flexion AROM 44 degrees    Time  4    Period  Weeks    Status  New    Target Date  04/06/17      PT SHORT TERM GOAL #3   Title  pt reduce R knee edema by >/= 1cm to promote knee mobility and reduce pain     Baseline  @ joint line 37.4cm , 10 cm above 34.5 cm, 10 cm  below 30cm     Time  4    Period  Weeks    Status  New    Target Date  04/06/17        PT Long Term Goals - 03/09/17 1246  PT LONG TERM GOAL #1   Title  increaes knee mobility to >/= -2 to 120 degrees to promote functional and efficent gait pattern     Baseline  ext -10 and flexion 44    Time  8    Period  Weeks    Status  New    Target Date  05/04/17      PT LONG TERM GOAL #2   Title  pt to increase R LE strength to >/= 4/5 to promote hip/ knee stability for walking/ standing ADLs    Baseline  unable to test R knee due to limitation    Time  8    Period  Weeks    Status  New    Target Date  05/04/17      PT LONG TERM GOAL #3   Title  pt to be able to sit/ stand and walk for >/= 45 min with <=2/10 pain and no AD for functional endurnace required for ADLs     Baseline  walk/ stand 10 min with 1 crutchand sit for 5     Time  8    Period  Weeks    Status  New    Target Date  05/04/17      PT LONG TERM GOAL #4   Title  increase FOTO score to </= 45% limited to demo improvement in function    Baseline  inital FOTO 76% limited    Time  8    Period  Weeks    Status  New    Target Date  05/04/17            Plan - 03/24/17 1141    Clinical Impression Statement  Pt reports he missed his last PT visit due to transportation. He is 4 weeks s/p R patella ORIF. We moved his brace to 30 degrees extension. His active heel slides measures 50 degrees. Asked him to increase frequency of HEP as he has only performed 1 x per day. Began ankle ROM and standing 2 way hip for hip ROM. Pt reports  already doing hip exercises at home. Performed gait in new brace setting, encouraging knee flexion in available ROM. Pt declined ice at end of treatment.     PT Next Visit Plan  review and update HEP, knee ROM/ mobs, hip / ankle strengthening    PT Home Exercise Plan  standing weight shifting, quad set, glute set and heel slides,     Consulted and Agree with Plan of Care  Patient       Patient will benefit from skilled therapeutic intervention in order to improve the following deficits and impairments:  Pain, Abnormal gait, Decreased endurance, Decreased activity tolerance, Decreased balance, Decreased range of motion, Impaired flexibility, Difficulty walking, Decreased strength, Decreased scar mobility, Improper body mechanics, Increased edema  Visit Diagnosis: Chronic pain of right knee  Stiffness of right knee, not elsewhere classified  Localized edema  Other abnormalities of gait and mobility  Muscle weakness (generalized)     Problem List Patient Active Problem List   Diagnosis Date Noted  . Fracture of patella with routine healing, right, closed 02/19/2017    Sherrie Mustache, PTA 03/24/2017, 11:54 AM  [facility] [address] [place], [place], [address] Phone: [phone]   Fax:  [phone]  Name: Juan Malone MRN: [id number] Date of Birth: 1960/11/08

## 2017-03-26 ENCOUNTER — Ambulatory Visit: Payer: Medicaid Other | Admitting: Physical Therapy

## 2017-03-26 ENCOUNTER — Encounter: Payer: Self-pay | Admitting: Physical Therapy

## 2017-03-26 DIAGNOSIS — M25661 Stiffness of right knee, not elsewhere classified: Secondary | ICD-10-CM

## 2017-03-26 DIAGNOSIS — M25561 Pain in right knee: Principal | ICD-10-CM

## 2017-03-26 DIAGNOSIS — M6281 Muscle weakness (generalized): Secondary | ICD-10-CM

## 2017-03-26 DIAGNOSIS — G8929 Other chronic pain: Secondary | ICD-10-CM

## 2017-03-26 DIAGNOSIS — R600 Localized edema: Secondary | ICD-10-CM

## 2017-03-26 DIAGNOSIS — R2689 Other abnormalities of gait and mobility: Secondary | ICD-10-CM

## 2017-03-26 NOTE — Therapy (Signed)
[facility] [address] [place], [place], [address] Phone: [phone]   Fax:  [phone]  Physical Therapy Treatment  Patient Details  Name: Juan Malone MRN: [id number] Date of Birth: November 24, 1960 Referring Provider: Ramond Marrow MD   Encounter Date: 03/26/2017  PT End of Session - 03/26/17 0951    Visit Number  3    Number of Visits  17    Date for PT Re-Evaluation  05/04/17    Authorization Type  Self pay (awating disability)    PT Start Time  0947    PT Stop Time  1040    PT Time Calculation (min)  53 min       Past Medical History:  Diagnosis Date  . Asthma   . PONV (postoperative nausea and vomiting)     Past Surgical History:  Procedure Laterality Date  . HAND SURGERY    . ORIF PATELLA Right 02/23/2017   Procedure: OPEN REDUCTION INTERNAL (ORIF) FIXATION PATELLA;  Surgeon: Bjorn Pippin, MD;  Location: [facility];  Service: Orthopedics;  Laterality: Right;  . SHOULDER SURGERY      There were no vitals filed for this visit.  Subjective Assessment - 03/26/17 0950    Subjective  9/10 with walking, Sharp jab.     Currently in Pain?  Yes    Pain Score  9     Pain Location  Knee    Pain Orientation  Right    Pain Descriptors / Indicators  Sharp    Aggravating Factors   bending when walking     Pain Relieving Factors  meds, ice          [facility] PT Assessment - 03/26/17 0001      Circumferential Edema   Circumferential - Right  36.2 cm @jt  line      AROM   Right Knee Flexion  60                  [facility] Adult PT Treatment/Exercise - 03/26/17 0001      Ambulation/Gait   Assistive device  L Axillary Crutch    Gait Pattern  Step-through pattern    Ambulation Surface  Level    Gait Comments  encourage to avoid sharp pain at knee with ambulation      Knee/Hip Exercises: Standing   Hip Abduction  20 reps    Hip Extension  20 reps      Knee/Hip Exercises: Supine   Quad Sets   20 reps    Heel Slides  20 reps 60 degrees      Modalities   Modalities  Vasopneumatic      Vasopneumatic   Number Minutes Vasopneumatic   15 minutes    Vasopnuematic Location   Knee    Vasopneumatic Pressure  Low    Vasopneumatic Temperature   34      Manual Therapy   Manual therapy comments  patella mobs all planes       Ankle Exercises: Supine   Other Supine Ankle Exercises  AROM all planes                PT Short Term Goals - 03/09/17 1243      PT SHORT TERM GOAL #1   Title  pt to be I with initial HEP     Baseline  no previous HEp    Time  4    Period  Weeks    Status  New  Target Date  05/04/17      PT SHORT TERM GOAL #2   Title  pt to increase knee flexion to >/= 90 degrees to promote functional therapuetic progression    Baseline  knee flexion AROM 44 degrees    Time  4    Period  Weeks    Status  New    Target Date  04/06/17      PT SHORT TERM GOAL #3   Title  pt reduce R knee edema by >/= 1cm to promote knee mobility and reduce pain     Baseline  @ joint line 37.4cm , 10 cm above 34.5 cm, 10 cm  below 30cm     Time  4    Period  Weeks    Status  New    Target Date  04/06/17        PT Long Term Goals - 03/09/17 1246      PT LONG TERM GOAL #1   Title  increaes knee mobility to >/= -2 to 120 degrees to promote functional and efficent gait pattern     Baseline  ext -10 and flexion 44    Time  8    Period  Weeks    Status  New    Target Date  05/04/17      PT LONG TERM GOAL #2   Title  pt to increase R LE strength to >/= 4/5 to promote hip/ knee stability for walking/ standing ADLs    Baseline  unable to test R knee due to limitation    Time  8    Period  Weeks    Status  New    Target Date  05/04/17      PT LONG TERM GOAL #3   Title  pt to be able to sit/ stand and walk for >/= 45 min with <=2/10 pain and no AD for functional endurnace required for ADLs     Baseline  walk/ stand 10 min with 1 crutchand sit for 5     Time  8     Period  Weeks    Status  New    Target Date  05/04/17      PT LONG TERM GOAL #4   Title  increase FOTO score to </= 45% limited to demo improvement in function    Baseline  inital FOTO 76% limited    Time  8    Period  Weeks    Status  New    Target Date  05/04/17            Plan - 03/26/17 1036    Clinical Impression Statement  Pt arrives for PT and reports ambulating in home without AD. He reports a sharp pain at theknee with walking with race unlocked to 30 degrees flexion. Encouraged him to avoid pain with ambulation. Performed manual patella mobs to improve ROM and patella mobillity. Audible crunch with first quad set after patella mobs. Began prone hamstring curls with good tolerance, added to HEP. Edema is 1 cm improved. Trial of vaso for continued edema management.  Pt reports feeling good at end of treatment.     PT Next Visit Plan  review and update HEP, knee ROM/ mobs, hip / ankle strengthening    PT Home Exercise Plan  standing weight shifting, quad set, glute set and heel slides, prone hamstring curls     Consulted and Agree with Plan of Care  Patient  Patient will benefit from skilled therapeutic intervention in order to improve the following deficits and impairments:  Pain, Abnormal gait, Decreased endurance, Decreased activity tolerance, Decreased balance, Decreased range of motion, Impaired flexibility, Difficulty walking, Decreased strength, Decreased scar mobility, Improper body mechanics, Increased edema  Visit Diagnosis: Chronic pain of right knee  Stiffness of right knee, not elsewhere classified  Localized edema  Other abnormalities of gait and mobility  Muscle weakness (generalized)     Problem List Patient Active Problem List   Diagnosis Date Noted  . Fracture of patella with routine healing, right, closed 02/19/2017    Sherrie Mustache, PTA 03/26/2017, 10:46 AM  [facility] [address] [place], [place], [address] Phone: [phone]   Fax:  [phone]  Name: Juan Malone MRN: [id number] Date of Birth: 08/12/1960

## 2017-03-26 NOTE — Addendum Note (Signed)
Addended by: Milford CageLEAMON, Khadir Roam L on: 03/26/2017 10:37 AM   Modules accepted: Orders

## 2017-03-29 ENCOUNTER — Ambulatory Visit: Payer: Medicaid Other | Admitting: Physical Therapy

## 2017-04-01 ENCOUNTER — Ambulatory Visit: Payer: Medicaid Other | Admitting: Physical Therapy

## 2017-04-01 ENCOUNTER — Encounter: Payer: Self-pay | Admitting: Physical Therapy

## 2017-04-01 DIAGNOSIS — R2689 Other abnormalities of gait and mobility: Secondary | ICD-10-CM

## 2017-04-01 DIAGNOSIS — M25561 Pain in right knee: Secondary | ICD-10-CM | POA: Diagnosis not present

## 2017-04-01 DIAGNOSIS — G8929 Other chronic pain: Secondary | ICD-10-CM

## 2017-04-01 DIAGNOSIS — R600 Localized edema: Secondary | ICD-10-CM

## 2017-04-01 DIAGNOSIS — M6281 Muscle weakness (generalized): Secondary | ICD-10-CM

## 2017-04-01 DIAGNOSIS — M25661 Stiffness of right knee, not elsewhere classified: Secondary | ICD-10-CM

## 2017-04-01 NOTE — Patient Instructions (Addendum)
NWB until 6 weeks.  All exercises with brace on No quad strengtheningGastroc / Heel Cord Stretch - Seated With Towel    Sit on floor, towel around ball of foot. Gently pull foot in toward body, stretching heel cord and calf. Hold for _30__ seconds. Repeat on involved leg. Repeat _3__ times. Do _1__ times per day.  Brace must be on.   Copyright  VHI. All rights reserved.

## 2017-04-01 NOTE — Therapy (Signed)
[place], [place], [address] Phone: [phone]   Fax:  [phone]  Physical Therapy Treatment  Patient Details  Name: Juan Malone MRN: [id number] Date of Birth: Feb 19, 1961 Referring Provider: Ophelia Charter MD   Encounter Date: 04/01/2017  PT End of Session - 04/01/17 1730    Visit Number  4    Number of Visits  17    Date for PT Re-Evaluation  05/04/17    PT Start Time  0852    PT Stop Time  0930    PT Time Calculation (min)  38 min    Activity Tolerance  Patient tolerated treatment well    Behavior During Therapy  [facility] for tasks assessed/performed       Past Medical History:  Diagnosis Date  . Asthma   . PONV (postoperative nausea and vomiting)     Past Surgical History:  Procedure Laterality Date  . HAND SURGERY    . ORIF PATELLA Right 02/23/2017   Procedure: OPEN REDUCTION INTERNAL (ORIF) FIXATION PATELLA;  Surgeon: Juan Gash, MD;  Location: [place];  Service: Orthopedics;  Laterality: Right;  . SHOULDER SURGERY      There were no vitals filed for this visit.  Subjective Assessment - 04/01/17 0853    Subjective  Sees the MD today due to increased pain.   He has brace at 30 .  He has been walking with 1 crutch.    Currently in Pain?  Yes    Pain Score  8     Pain Location  Knee    Pain Orientation  Right    Pain Descriptors / Indicators  Sharp    Pain Type  Surgical pain    Pain Frequency  Constant    Aggravating Factors   keeps him up at night.   walking.     Pain Relieving Factors  ice,  elevation    Multiple Pain Sites  No         [facility] PT Assessment - 04/01/17 0001      Circumferential Edema   Circumferential - Right  35 at joint line.                  [place] Adult PT Treatment/Exercise - 04/01/17 0001      Self-Care   Self-Care  Other Self-Care Comments    Other Self-Care Comments   Precautions reviewed for patellar tendon repair.   Brace on with exercise,  No quad strengthening      Knee/Hip Exercises: Stretches   Passive Hamstring Stretch  3 reps;30 seconds    Gastroc Stretch  3 reps;30 seconds      Knee/Hip Exercises: Standing   Knee Flexion  3 sets;10 reps Brace on      Manual Therapy   Manual therapy comments  retrograde soft tissue edema visably decreased.      Ankle Exercises: Supine   Other Supine Ankle Exercises  AROM all planes  3 X 10 reps.  Brace on with exercise.             PT Education - 04/01/17 0919    Education provided  Yes    Education Details  4 week precautions.   HEP    Person(s) Educated  Patient    Methods  Explanation    Comprehension  Verbalized understanding       PT Short Term Goals - 04/01/17 1737      PT SHORT TERM  GOAL #1   Title  pt to be I with initial HEP     Baseline  cues    Time  4    Period  Weeks    Status  On-going      PT SHORT TERM GOAL #2   Title  pt to increase knee flexion to >/= 90 degrees to promote functional therapuetic progression    Baseline  AROM in prone not formally measured,  appears to be around 35 degrees  with brace on .    Time  4    Period  Weeks    Status  On-going      PT SHORT TERM GOAL #3   Title  pt reduce R knee edema by >/= 1cm to promote knee mobility and reduce pain     Baseline  joint line 35 cm    Time  4    Period  Weeks    Status  Partially Met        PT Long Term Goals - 03/09/17 1246      PT LONG TERM GOAL #1   Title  increaes knee mobility to >/= -2 to 120 degrees to promote functional and efficent gait pattern     Baseline  ext -10 and flexion 44    Time  8    Period  Weeks    Status  New    Target Date  05/04/17      PT LONG TERM GOAL #2   Title  pt to increase R LE strength to >/= 4/5 to promote hip/ knee stability for walking/ standing ADLs    Baseline  unable to test R knee due to limitation    Time  8    Period  Weeks    Status  New    Target Date  05/04/17      PT LONG TERM GOAL #3    Title  pt to be able to sit/ stand and walk for >/= 45 min with <=2/10 pain and no AD for functional endurnace required for ADLs     Baseline  walk/ stand 10 min with 1 crutchand sit for 5     Time  8    Period  Weeks    Status  New    Target Date  05/04/17      PT LONG TERM GOAL #4   Title  increase FOTO score to </= 45% limited to demo improvement in function    Baseline  inital FOTO 76% limited    Time  8    Period  Weeks    Status  New    Target Date  05/04/17            Plan - 04/01/17 1731    Clinical Impression Statement  Pain severe at times per patient.  He is not following precautions with gait.  Brace adjusted to 60 degrees today.  Girth joint line 35 cm. Less pain post session today.  patient declined the need for modalities for pain. Marland Kitchen  STG#3 partially met.    PT Next Visit Plan  review and update HEP, knee ROM/ mobs, hip / ankle strengthening,  Patella tendon repair protocol.     PT Home Exercise Plan  standing weight shifting, quad set, glute set and heel slides, prone hamstring curls        Patient will benefit from skilled therapeutic intervention in order to improve the following deficits and impairments:     Visit Diagnosis:  Chronic pain of right knee  Stiffness of right knee, not elsewhere classified  Localized edema  Other abnormalities of gait and mobility  Muscle weakness (generalized)     Problem List Patient Active Problem List   Diagnosis Date Noted  . Fracture of patella with routine healing, right, closed 02/19/2017    [name] PTA 04/01/2017, 5:42 PM  [facility] [address] [place], [place], [address] Phone: [phone]   Fax:  [phone]  Name: Juan Malone MRN: [id number] Date of Birth: [birth year]

## 2017-04-06 ENCOUNTER — Encounter: Payer: Self-pay | Admitting: Physical Therapy

## 2017-04-06 ENCOUNTER — Ambulatory Visit: Payer: Medicaid Other | Admitting: Physical Therapy

## 2017-04-06 DIAGNOSIS — M25561 Pain in right knee: Principal | ICD-10-CM

## 2017-04-06 DIAGNOSIS — G8929 Other chronic pain: Secondary | ICD-10-CM

## 2017-04-06 DIAGNOSIS — M25661 Stiffness of right knee, not elsewhere classified: Secondary | ICD-10-CM

## 2017-04-06 DIAGNOSIS — R2689 Other abnormalities of gait and mobility: Secondary | ICD-10-CM

## 2017-04-06 DIAGNOSIS — M6281 Muscle weakness (generalized): Secondary | ICD-10-CM

## 2017-04-06 DIAGNOSIS — R600 Localized edema: Secondary | ICD-10-CM

## 2017-04-06 NOTE — Therapy (Signed)
[place], [place], [address] Phone: [phone]   Fax:  [phone]  Physical Therapy Treatment  Patient Details  Name: Juan Malone MRN: [id number] Date of Birth: 07-31-1960 Referring Provider: Ophelia Charter MD   Encounter Date: 04/06/2017  PT End of Session - 04/06/17 0907    Visit Number  5    Number of Visits  17    Date for PT Re-Evaluation  05/04/17    Authorization Type  Self pay (awating disability)    PT Start Time  0847    PT Stop Time  0930    PT Time Calculation (min)  43 min       Past Medical History:  Diagnosis Date  . Asthma   . PONV (postoperative nausea and vomiting)     Past Surgical History:  Procedure Laterality Date  . HAND SURGERY    . ORIF PATELLA Right 02/23/2017   Procedure: OPEN REDUCTION INTERNAL (ORIF) FIXATION PATELLA;  Surgeon: Hiram Gash, MD;  Location: [place];  Service: Orthopedics;  Laterality: Right;  . SHOULDER SURGERY      There were no vitals filed for this visit.  Subjective Assessment - 04/06/17 0905    Subjective  Saw MD Thursday, gave me new orders to wean from brace when I can do a stright leg raise.     Currently in Pain?  Yes    Pain Score  7  between 7-8/10    Pain Location  Knee    Pain Orientation  Right    Pain Descriptors / Indicators  Sharp;Sore;Throbbing    Pain Type  Surgical pain    Aggravating Factors   intermittent with walking     Pain Relieving Factors  ice, elevation                       [facility] Adult PT Treatment/Exercise - 04/06/17 0001      Knee/Hip Exercises: Stretches   Gastroc Stretch  3 reps;30 seconds      Knee/Hip Exercises: Standing   Hip Abduction  20 reps    Hip Extension  20 reps      Knee/Hip Exercises: Supine   Quad Sets  20 reps    Heel Slides  20 reps 60 degrees    Straight Leg Raises  5 reps brace locked in extension      Knee/Hip Exercises: Sidelying   Hip  ABduction  10 reps;2 sets      Knee/Hip Exercises: Prone   Hamstring Curl  20 reps    Hip Extension  20 reps      Ankle Exercises: Supine   Other Supine Ankle Exercises  AROM all planes              PT Education - 04/06/17 1222    Education provided  Yes    Education Details  lock brace with SLR, unlock brace for ROM     Person(s) Educated  Patient    Methods  Explanation;Handout    Comprehension  Verbalized understanding       PT Short Term Goals - 04/01/17 1737      PT SHORT TERM GOAL #1   Title  pt to be I with initial HEP     Baseline  cues    Time  4    Period  Weeks    Status  On-going      PT SHORT TERM GOAL #2  Title  pt to increase knee flexion to >/= 90 degrees to promote functional therapuetic progression    Baseline  AROM in prone not formally measured,  appears to be around 35 degrees  with brace on .    Time  4    Period  Weeks    Status  On-going      PT SHORT TERM GOAL #3   Title  pt reduce R knee edema by >/= 1cm to promote knee mobility and reduce pain     Baseline  joint line 35 cm    Time  4    Period  Weeks    Status  Partially Met        PT Long Term Goals - 03/09/17 1246      PT LONG TERM GOAL #1   Title  increaes knee mobility to >/= -2 to 120 degrees to promote functional and efficent gait pattern     Baseline  ext -10 and flexion 44    Time  8    Period  Weeks    Status  New    Target Date  05/04/17      PT LONG TERM GOAL #2   Title  pt to increase R LE strength to >/= 4/5 to promote hip/ knee stability for walking/ standing ADLs    Baseline  unable to test R knee due to limitation    Time  8    Period  Weeks    Status  New    Target Date  05/04/17      PT LONG TERM GOAL #3   Title  pt to be able to sit/ stand and walk for >/= 45 min with <=2/10 pain and no AD for functional endurnace required for ADLs     Baseline  walk/ stand 10 min with 1 crutchand sit for 5     Time  8    Period  Weeks    Status  New    Target  Date  05/04/17      PT LONG TERM GOAL #4   Title  increase FOTO score to </= 45% limited to demo improvement in function    Baseline  inital FOTO 76% limited    Time  8    Period  Weeks    Status  New    Target Date  05/04/17            Plan - 04/06/17 0912    Clinical Impression Statement  Pt arrives with script from MD to continue per protocol and wean from brace when he can do SLR. He received pain medication from MD which is helping his pain. He reports sharp pain are intermittent now.  He is 6 weeks post op today and per protocol should be working up to 90 ROM. Unclocked brace to 90 for flexion ROM and gait.  Began SLR with brace locked in extension for now. Pt is independent with adjusting brace so will allow him to try SLR at home only if locked in extension.     PT Next Visit Plan  review and update HEP, knee ROM/ mobs, hip / ankle strengthening,  Patella tendon repair protocol. 6 weeks post op, needs to do SLR to wean from brace.     PT Home Exercise Plan  standing weight shifting, quad set, glute set and heel slides, prone hamstring curls     Consulted and Agree with Plan of Care  Patient  Patient will benefit from skilled therapeutic intervention in order to improve the following deficits and impairments:  Pain, Abnormal gait, Decreased endurance, Decreased activity tolerance, Decreased balance, Decreased range of motion, Impaired flexibility, Difficulty walking, Decreased strength, Decreased scar mobility, Improper body mechanics, Increased edema  Visit Diagnosis: Chronic pain of right knee  Stiffness of right knee, not elsewhere classified  Localized edema  Other abnormalities of gait and mobility  Muscle weakness (generalized)     Problem List Patient Active Problem List   Diagnosis Date Noted  . Fracture of patella with routine healing, right, closed 02/19/2017    Dorene Ar, PTA 04/06/2017, 12:26 PM  [facility] [facility] [address] [place], [place], [address] Phone: [phone]   Fax:  [phone]  Name: Juan Malone MRN: [id number] Date of Birth: 1961/03/19

## 2017-04-06 NOTE — Patient Instructions (Signed)
Lock Brace in extension while performing Straight leg raise. Perform 5 to 10 reps at a time as pain allows.   Unlock brace for heel slides and knee bending (laying on your stomach).

## 2017-04-08 ENCOUNTER — Encounter: Payer: Self-pay | Admitting: Physical Therapy

## 2017-04-08 ENCOUNTER — Ambulatory Visit: Payer: Medicaid Other | Admitting: Physical Therapy

## 2017-04-08 DIAGNOSIS — M25661 Stiffness of right knee, not elsewhere classified: Secondary | ICD-10-CM

## 2017-04-08 DIAGNOSIS — M6281 Muscle weakness (generalized): Secondary | ICD-10-CM

## 2017-04-08 DIAGNOSIS — M25561 Pain in right knee: Secondary | ICD-10-CM | POA: Diagnosis not present

## 2017-04-08 DIAGNOSIS — G8929 Other chronic pain: Secondary | ICD-10-CM

## 2017-04-08 DIAGNOSIS — R600 Localized edema: Secondary | ICD-10-CM

## 2017-04-08 DIAGNOSIS — R2689 Other abnormalities of gait and mobility: Secondary | ICD-10-CM

## 2017-04-08 NOTE — Therapy (Signed)
[place], [place], [address] Phone: [phone]   Fax:  [phone]  Physical Therapy Treatment  Patient Details  Name: Juan Malone MRN: [id number] Date of Birth: 1960-07-10 Referring Provider: Ophelia Charter MD   Encounter Date: 04/08/2017  PT End of Session - 04/08/17 0903    Visit Number  6    Number of Visits  17    Date for PT Re-Evaluation  05/04/17    Authorization Type  Self pay (awating disability)    PT Start Time  [phone]    PT Stop Time  0930    PT Time Calculation (min)  44 min    Activity Tolerance  Patient tolerated treatment well    Behavior During Therapy  [facility] for tasks assessed/performed       Past Medical History:  Diagnosis Date  . Asthma   . PONV (postoperative nausea and vomiting)     Past Surgical History:  Procedure Laterality Date  . HAND SURGERY    . ORIF PATELLA Right 02/23/2017   Procedure: OPEN REDUCTION INTERNAL (ORIF) FIXATION PATELLA;  Surgeon: Hiram Gash, MD;  Location: [place];  Service: Orthopedics;  Laterality: Right;  . SHOULDER SURGERY      There were no vitals filed for this visit.  Subjective Assessment - 04/08/17 0852    Subjective  "I am still having pain at a 6-7/10, still working on getting knee moving"     Currently in Pain?  Yes    Pain Score  7     Pain Location  Knee    Pain Orientation  Right    Pain Type  Surgical pain                      [facility] Adult PT Treatment/Exercise - 04/08/17 0855      Knee/Hip Exercises: Aerobic   Stationary Bike  pushing through LLE only going 25% to promote R knee bend  verbal cues to avoid pushing through RLE       Knee/Hip Exercises: Supine   [employer]  Strengthening;AROM;Right;2 sets;10 reps with small blue bolster and theraband under foot 50 deg flex    Bridges  2 sets;10 reps with knees on bolster pushing through knee, glute squeeze    Straight Leg Raises  2  sets;20 reps attempted quad set during but unable to perform with quad se      Knee/Hip Exercises: Sidelying   Hip ABduction  AROM;Strengthening;Right;20 reps;2 sets elliptical motion 1 set CW/ 1 set CCW      Manual Therapy   Manual therapy comments  cross friction massage along patellar tendon and ASTYM techniques             PT Education - 04/08/17 0924    Education provided  Yes    Education Details  reviewed protocol and updated HEP for hip strengthening.     Person(s) Educated  Patient    Methods  Explanation;Verbal cues    Comprehension  Verbalized understanding;Verbal cues required       PT Short Term Goals - 04/01/17 1737      PT SHORT TERM GOAL #1   Title  pt to be I with initial HEP     Baseline  cues    Time  4    Period  Weeks    Status  On-going      PT SHORT TERM GOAL #2   Title  pt to increase knee flexion to >/= 90 degrees to promote functional therapuetic progression    Baseline  AROM in prone not formally measured,  appears to be around 35 degrees  with brace on .    Time  4    Period  Weeks    Status  On-going      PT SHORT TERM GOAL #3   Title  pt reduce R knee edema by >/= 1cm to promote knee mobility and reduce pain     Baseline  joint line 35 cm    Time  4    Period  Weeks    Status  Partially Met        PT Long Term Goals - 03/09/17 1246      PT LONG TERM GOAL #1   Title  increaes knee mobility to >/= -2 to 120 degrees to promote functional and efficent gait pattern     Baseline  ext -10 and flexion 44    Time  8    Period  Weeks    Status  New    Target Date  05/04/17      PT LONG TERM GOAL #2   Title  pt to increase R LE strength to >/= 4/5 to promote hip/ knee stability for walking/ standing ADLs    Baseline  unable to test R knee due to limitation    Time  8    Period  Weeks    Status  New    Target Date  05/04/17      PT LONG TERM GOAL #3   Title  pt to be able to sit/ stand and walk for >/= 45 min with <=2/10 pain and  no AD for functional endurnace required for ADLs     Baseline  walk/ stand 10 min with 1 crutchand sit for 5     Time  8    Period  Weeks    Status  New    Target Date  05/04/17      PT LONG TERM GOAL #4   Title  increase FOTO score to </= 45% limited to demo improvement in function    Baseline  inital FOTO 76% limited    Time  8    Period  Weeks    Status  New    Target Date  05/04/17            Plan - 04/08/17 0919    Clinical Impression Statement  pt continues to report 6-7/10 pain in the R knee. progressing per protocol with gradual quad activtion working SAQ with 50 degree bend which he performed well. continued working on hip strength with braced locked which he is progressing but was unable to perform SLR with quad set due to soreness. plan to continue progressing with strengthening.     PT Next Visit Plan  review and update HEP, knee ROM/ mobs, hip / ankle strengthening,  Patella tendon repair protocol. 7 weeks post op, needs to do SLR to wean from brace. light SAQ quad activation     PT Home Exercise Plan  standing weight shifting, quad set, glute set and heel slides, prone hamstring curls, sidelying hip abduction.     Consulted and Agree with Plan of Care  Patient       Patient will benefit from skilled therapeutic intervention in order to improve the following deficits and impairments:  Pain, Abnormal gait, Decreased endurance, Decreased activity tolerance, Decreased balance, Decreased range of motion,  Impaired flexibility, Difficulty walking, Decreased strength, Decreased scar mobility, Improper body mechanics, Increased edema  Visit Diagnosis: Chronic pain of right knee  Stiffness of right knee, not elsewhere classified  Localized edema  Other abnormalities of gait and mobility  Muscle weakness (generalized)     Problem List Patient Active Problem List   Diagnosis Date Noted  . Fracture of patella with routine healing, right, closed 02/19/2017    [name] PT, DPT, LAT, ATC  04/08/17  9:30 AM      [place], [place], [address] Phone: [phone]   Fax:  [phone]  Name: Juan Malone MRN: [id number] Date of Birth: [birth year]

## 2017-04-13 ENCOUNTER — Encounter: Payer: Self-pay | Admitting: Physical Therapy

## 2017-04-13 ENCOUNTER — Ambulatory Visit: Payer: Medicaid Other | Admitting: Physical Therapy

## 2017-04-13 DIAGNOSIS — R2689 Other abnormalities of gait and mobility: Secondary | ICD-10-CM

## 2017-04-13 DIAGNOSIS — M25561 Pain in right knee: Secondary | ICD-10-CM | POA: Diagnosis not present

## 2017-04-13 DIAGNOSIS — M6281 Muscle weakness (generalized): Secondary | ICD-10-CM

## 2017-04-13 DIAGNOSIS — M25661 Stiffness of right knee, not elsewhere classified: Secondary | ICD-10-CM

## 2017-04-13 DIAGNOSIS — R600 Localized edema: Secondary | ICD-10-CM

## 2017-04-13 DIAGNOSIS — G8929 Other chronic pain: Secondary | ICD-10-CM

## 2017-04-13 NOTE — Therapy (Signed)
[place], [place], [address] Phone: [phone]   Fax:  [phone]  Physical Therapy Treatment  Patient Details  Name: Juan Malone MRN: [id number] Date of Birth: 1961/01/24 Referring Provider: Ophelia Charter MD   Encounter Date: 04/13/2017  PT End of Session - 04/13/17 1009    Visit Number  7    Number of Visits  17    Date for PT Re-Evaluation  05/04/17    Authorization Type  Self pay (awating disability)    PT Start Time  0931    PT Stop Time  1010    PT Time Calculation (min)  39 min    Activity Tolerance  Patient tolerated treatment well    Behavior During Therapy  [facility] for tasks assessed/performed       Past Medical History:  Diagnosis Date  . Asthma   . PONV (postoperative nausea and vomiting)     Past Surgical History:  Procedure Laterality Date  . HAND SURGERY    . ORIF PATELLA Right 02/23/2017   Procedure: OPEN REDUCTION INTERNAL (ORIF) FIXATION PATELLA;  Surgeon: Hiram Gash, MD;  Location: [facility];  Service: Orthopedics;  Laterality: Right;  . SHOULDER SURGERY      There were no vitals filed for this visit.  Subjective Assessment - 04/13/17 0935    Subjective  c/o stiffness in the mornings; and some pain with increased walking.  trying to walk at home without the crutches.    Patient Stated Goals  to be able walk again, return to work     Currently in Pain?  Yes    Pain Score  7     Pain Location  Knee    Pain Orientation  Right    Pain Descriptors / Indicators  Sharp;Sore;Throbbing    Pain Type  Surgical pain    Pain Onset  1 to 4 weeks ago    Pain Frequency  Constant    Aggravating Factors   walking too much    Pain Relieving Factors  ice, elevation                      [facility] Adult PT Treatment/Exercise - 04/13/17 0936      Ambulation/Gait   Gait Comments  amb with Lt axillary crutch with proper technique; encouraged trying to wean  from both crutches to single crutch as able      Knee/Hip Exercises: Aerobic   Stationary Bike  pushing through LLE only going 25% to promote R knee bend x 8 min      Knee/Hip Exercises: Standing   Heel Raises  Both;20 reps Rt toe raises x 20    Knee Flexion  Both;20 reps    Hip Abduction  Both;20 reps;Knee straight    Hip Extension  Both;20 reps;Knee straight      Knee/Hip Exercises: Sidelying   Hip ABduction  AROM;Strengthening;Right;20 reps;2 sets elliptical motion 1 set CW/ 1 set CCW      Knee/Hip Exercises: Prone   Hamstring Curl  20 reps    Hip Extension  20 reps      Manual Therapy   Manual therapy comments  patellar mobs; scar mobilization and cross friction massage along patellar tendon               PT Short Term Goals - 04/01/17 1737      PT SHORT TERM GOAL #1   Title  pt to be  I with initial HEP     Baseline  cues    Time  4    Period  Weeks    Status  On-going      PT SHORT TERM GOAL #2   Title  pt to increase knee flexion to >/= 90 degrees to promote functional therapuetic progression    Baseline  AROM in prone not formally measured,  appears to be around 35 degrees  with brace on .    Time  4    Period  Weeks    Status  On-going      PT SHORT TERM GOAL #3   Title  pt reduce R knee edema by >/= 1cm to promote knee mobility and reduce pain     Baseline  joint line 35 cm    Time  4    Period  Weeks    Status  Partially Met        PT Long Term Goals - 03/09/17 1246      PT LONG TERM GOAL #1   Title  increaes knee mobility to >/= -2 to 120 degrees to promote functional and efficent gait pattern     Baseline  ext -10 and flexion 44    Time  8    Period  Weeks    Status  New    Target Date  05/04/17      PT LONG TERM GOAL #2   Title  pt to increase R LE strength to >/= 4/5 to promote hip/ knee stability for walking/ standing ADLs    Baseline  unable to test R knee due to limitation    Time  8    Period  Weeks    Status  New    Target  Date  05/04/17      PT LONG TERM GOAL #3   Title  pt to be able to sit/ stand and walk for >/= 45 min with <=2/10 pain and no AD for functional endurnace required for ADLs     Baseline  walk/ stand 10 min with 1 crutchand sit for 5     Time  8    Period  Weeks    Status  New    Target Date  05/04/17      PT LONG TERM GOAL #4   Title  increase FOTO score to </= 45% limited to demo improvement in function    Baseline  inital FOTO 76% limited    Time  8    Period  Weeks    Status  New    Target Date  05/04/17            Plan - 04/13/17 1010    Clinical Impression Statement  Pt tolerated session well despite elevated pain levels.  Progressing well within protocol and anticipate will be ready to progress quad strengthening nex 1-2 sessions.    PT Treatment/Interventions  ADLs/Self Care Home Management;Electrical Stimulation;Iontophoresis 30m/ml Dexamethasone;Moist Heat;Ultrasound;Gait training;Stair training;Therapeutic activities;Therapeutic exercise;Manual techniques;Taping;Passive range of motion;Scar mobilization;Balance training;Neuromuscular re-education;Cryotherapy;Vasopneumatic Device    PT Next Visit Plan  review and update HEP, knee ROM/ mobs, hip / ankle strengthening,  Patella tendon repair protocol. 7 weeks post op, needs to do SLR to wean from brace. light SAQ quad activation     Consulted and Agree with Plan of Care  Patient       Patient will benefit from skilled therapeutic intervention in order to improve the following deficits and impairments:  Pain, Abnormal gait,  Decreased endurance, Decreased activity tolerance, Decreased balance, Decreased range of motion, Impaired flexibility, Difficulty walking, Decreased strength, Decreased scar mobility, Improper body mechanics, Increased edema  Visit Diagnosis: Chronic pain of right knee  Stiffness of right knee, not elsewhere classified  Localized edema  Other abnormalities of gait and mobility  Muscle weakness  (generalized)     Problem List Patient Active Problem List   Diagnosis Date Noted  . Fracture of patella with routine healing, right, closed 02/19/2017      Laureen Abrahams, PT, DPT 04/13/17 10:12 AM    [place] [facility] [address] [place], [place], [address] Phone: [phone]   Fax:  [phone]  Name: Juan Malone MRN: [id number] Date of Birth: 1960-09-09

## 2017-04-15 ENCOUNTER — Ambulatory Visit: Payer: Medicaid Other | Admitting: Physical Therapy

## 2017-04-15 ENCOUNTER — Encounter: Payer: Self-pay | Admitting: Physical Therapy

## 2017-04-15 DIAGNOSIS — M25561 Pain in right knee: Principal | ICD-10-CM

## 2017-04-15 DIAGNOSIS — R600 Localized edema: Secondary | ICD-10-CM

## 2017-04-15 DIAGNOSIS — M25661 Stiffness of right knee, not elsewhere classified: Secondary | ICD-10-CM

## 2017-04-15 DIAGNOSIS — M6281 Muscle weakness (generalized): Secondary | ICD-10-CM

## 2017-04-15 DIAGNOSIS — G8929 Other chronic pain: Secondary | ICD-10-CM

## 2017-04-15 DIAGNOSIS — R2689 Other abnormalities of gait and mobility: Secondary | ICD-10-CM

## 2017-04-15 NOTE — Therapy (Signed)
[place], [place], [address] Phone: [phone]   Fax:  [phone]  Physical Therapy Treatment  Patient Details  Name: Juan Malone MRN: [id number] Date of Birth: 18-Jan-1961 Referring Provider: Ophelia Charter MD   Encounter Date: 04/15/2017  PT End of Session - 04/15/17 1009    Visit Number  8    Number of Visits  17    Date for PT Re-Evaluation  05/04/17    Authorization Type  Self pay (awating disability)    PT Start Time  0930    PT Stop Time  1009    PT Time Calculation (min)  39 min    Activity Tolerance  Patient tolerated treatment well    Behavior During Therapy  [facility] for tasks assessed/performed       Past Medical History:  Diagnosis Date  . Asthma   . PONV (postoperative nausea and vomiting)     Past Surgical History:  Procedure Laterality Date  . HAND SURGERY    . ORIF PATELLA Right 02/23/2017   Procedure: OPEN REDUCTION INTERNAL (ORIF) FIXATION PATELLA;  Surgeon: Hiram Gash, MD;  Location: [facility];  Service: Orthopedics;  Laterality: Right;  . SHOULDER SURGERY      There were no vitals filed for this visit.  Subjective Assessment - 04/15/17 0933    Subjective  doing well; walked in with one crutch today.      Patient Stated Goals  to be able walk again, return to work     Currently in Pain?  Yes    Pain Score  6     Pain Location  Knee    Pain Orientation  Right    Pain Descriptors / Indicators  Sharp;Sore;Throbbing    Pain Type  Surgical pain    Pain Onset  More than a month ago    Pain Frequency  Intermittent    Aggravating Factors   walking too much    Pain Relieving Factors  ice, elevation                      [facility] Adult PT Treatment/Exercise - 04/15/17 0933      Knee/Hip Exercises: Aerobic   Stationary Bike  pushing through LLE only going 25% to promote R knee bend x 8 min      Knee/Hip Exercises: Supine   Quad Sets  20  reps;Right    [employer]  Strengthening;AROM;Right;2 sets;10 reps 5 sec hold; x 20 with towel squeeze for VMO    Heel Slides  Right;20 reps brace off    Straight Leg Raises  2 sets;20 reps without brace and cues for decreased extensor lag    Straight Leg Raise with External Rotation  Right;10 reps;AAROM;Limitations    Straight Leg Raise with External Rotation Limitations  passive concentric initially progressing to AA concentric; active eccentric for all 10 reps      Knee/Hip Exercises: Sidelying   Hip ABduction  Right;20 reps      Knee/Hip Exercises: Prone   Hamstring Curl  20 reps    Hip Extension  20 reps      Manual Therapy   Manual therapy comments  patellar mobs; scar mobilization and cross friction massage along patellar tendon             PT Education - 04/15/17 1008    Education provided  Yes    Education Details  SLR  Person(s) Educated  Patient    Methods  Explanation;Demonstration;Handout    Comprehension  Verbalized understanding;Returned demonstration       PT Short Term Goals - 04/01/17 1737      PT SHORT TERM GOAL #1   Title  pt to be I with initial HEP     Baseline  cues    Time  4    Period  Weeks    Status  On-going      PT SHORT TERM GOAL #2   Title  pt to increase knee flexion to >/= 90 degrees to promote functional therapuetic progression    Baseline  AROM in prone not formally measured,  appears to be around 35 degrees  with brace on .    Time  4    Period  Weeks    Status  On-going      PT SHORT TERM GOAL #3   Title  pt reduce R knee edema by >/= 1cm to promote knee mobility and reduce pain     Baseline  joint line 35 cm    Time  4    Period  Weeks    Status  Partially Met        PT Long Term Goals - 03/09/17 1246      PT LONG TERM GOAL #1   Title  increaes knee mobility to >/= -2 to 120 degrees to promote functional and efficent gait pattern     Baseline  ext -10 and flexion 44    Time  8    Period  Weeks    Status   New    Target Date  05/04/17      PT LONG TERM GOAL #2   Title  pt to increase R LE strength to >/= 4/5 to promote hip/ knee stability for walking/ standing ADLs    Baseline  unable to test R knee due to limitation    Time  8    Period  Weeks    Status  New    Target Date  05/04/17      PT LONG TERM GOAL #3   Title  pt to be able to sit/ stand and walk for >/= 45 min with <=2/10 pain and no AD for functional endurnace required for ADLs     Baseline  walk/ stand 10 min with 1 crutchand sit for 5     Time  8    Period  Weeks    Status  New    Target Date  05/04/17      PT LONG TERM GOAL #4   Title  increase FOTO score to </= 45% limited to demo improvement in function    Baseline  inital FOTO 76% limited    Time  8    Period  Weeks    Status  New    Target Date  05/04/17            Plan - 04/15/17 1009    Clinical Impression Statement  Pt tolerated quad strengthening well with decreasing extensor lag with SLR.  Progressing well within protocol.    Rehab Potential  Good    PT Treatment/Interventions  ADLs/Self Care Home Management;Electrical Stimulation;Iontophoresis 81m/ml Dexamethasone;Moist Heat;Ultrasound;Gait training;Stair training;Therapeutic activities;Therapeutic exercise;Manual techniques;Taping;Passive range of motion;Scar mobilization;Balance training;Neuromuscular re-education;Cryotherapy;Vasopneumatic Device    PT Next Visit Plan  review and update HEP, knee ROM/ mobs, hip / ankle strengthening,  Patella tendon repair protocol. 7 weeks post op, needs to do SLR to  wean from brace. light SAQ quad activation     PT Home Exercise Plan  standing weight shifting, quad set, glute set and heel slides, prone hamstring curls, sidelying hip abduction. SLR       Patient will benefit from skilled therapeutic intervention in order to improve the following deficits and impairments:  Pain, Abnormal gait, Decreased endurance, Decreased activity tolerance, Decreased balance,  Decreased range of motion, Impaired flexibility, Difficulty walking, Decreased strength, Decreased scar mobility, Improper body mechanics, Increased edema  Visit Diagnosis: Chronic pain of right knee  Stiffness of right knee, not elsewhere classified  Localized edema  Other abnormalities of gait and mobility  Muscle weakness (generalized)     Problem List Patient Active Problem List   Diagnosis Date Noted  . Fracture of patella with routine healing, right, closed 02/19/2017      Laureen Abrahams, PT, DPT 04/15/17 10:11 AM    [place], [place], [address] Phone: [phone]   Fax:  [phone]  Name: Juan Malone MRN: [id number] Date of Birth: 1960/04/10

## 2017-04-15 NOTE — Patient Instructions (Signed)
Straight Leg Raise    Slowly raise locked right leg __6-8__ inches from floor. Repeat __10__ times per set. Do _2___ sets per session. Do __2-3__ sessions per day.

## 2017-04-20 ENCOUNTER — Ambulatory Visit: Payer: Medicaid Other | Admitting: Physical Therapy

## 2017-04-20 ENCOUNTER — Encounter: Payer: Self-pay | Admitting: Physical Therapy

## 2017-04-20 DIAGNOSIS — M6281 Muscle weakness (generalized): Secondary | ICD-10-CM

## 2017-04-20 DIAGNOSIS — G8929 Other chronic pain: Secondary | ICD-10-CM

## 2017-04-20 DIAGNOSIS — M25561 Pain in right knee: Principal | ICD-10-CM

## 2017-04-20 DIAGNOSIS — R600 Localized edema: Secondary | ICD-10-CM

## 2017-04-20 DIAGNOSIS — R2689 Other abnormalities of gait and mobility: Secondary | ICD-10-CM

## 2017-04-20 DIAGNOSIS — M25661 Stiffness of right knee, not elsewhere classified: Secondary | ICD-10-CM

## 2017-04-20 NOTE — Therapy (Signed)
[place], [place], [address] Phone: [phone]   Fax:  [phone]  Physical Therapy Treatment  Patient Details  Name: Juan Malone MRN: [id number] Date of Birth: 1960/05/13 Referring Provider: Ophelia Charter MD   Encounter Date: 04/20/2017  PT End of Session - 04/20/17 1407    Visit Number  9    Number of Visits  17    Date for PT Re-Evaluation  05/04/17    PT Start Time  0851    PT Stop Time  0930    PT Time Calculation (min)  39 min    Activity Tolerance  Patient tolerated treatment well    Behavior During Therapy  [facility] for tasks assessed/performed       Past Medical History:  Diagnosis Date  . Asthma   . PONV (postoperative nausea and vomiting)     Past Surgical History:  Procedure Laterality Date  . HAND SURGERY    . ORIF PATELLA Right 02/23/2017   Procedure: OPEN REDUCTION INTERNAL (ORIF) FIXATION PATELLA;  Surgeon: Hiram Gash, MD;  Location: [place];  Service: Orthopedics;  Laterality: Right;  . SHOULDER SURGERY      There were no vitals filed for this visit.  Subjective Assessment - 04/20/17 0901    Subjective  Stiff this morning.  I did the exercises 2-3 x a dat this weekend.Marland Kitchen  i was able to walk outside up and down the block.  ( 20 minutes)I am not feeling the sharp paing    Currently in Pain?  Yes    Pain Score  2     Pain Location  Knee    Pain Orientation  Right    Pain Descriptors / Indicators  Tightness under the knee cap    Pain Frequency  Intermittent    Aggravating Factors   stiff in the am,  walking too much    Pain Relieving Factors  ice,  elevation.         [facility] PT Assessment - 04/20/17 0001      PROM   Right Knee Flexion  77 on recumbant                  [facility] Adult PT Treatment/Exercise - 04/20/17 0001      Knee/Hip Exercises: Standing   Knee Flexion  20 reps    Hip Abduction  20 reps    Hip Extension  20 reps right,   knee straight      Knee/Hip Exercises: Supine   Quad Sets  20 reps tandem both    Heel Slides  20 reps AA    Straight Leg Raises  -- cues,  no brace  multiple reps,focuson patellar movement      Manual Therapy   Manual Therapy  Soft tissue mobilization    Manual therapy comments  Non tight floating with edema    Soft tissue mobilization  retrograde soft tissue work thigh knee,  Passive ROM hamstrings.             PT Education - 04/20/17 1407    Education provided  No       PT Short Term Goals - 04/20/17 1409      PT SHORT TERM GOAL #1   Title  pt to be I with initial HEP     Baseline  independent    Time  4    Period  Weeks    Status  Achieved  PT SHORT TERM GOAL #2   Title  pt to increase knee flexion to >/= 90 degrees to promote functional therapuetic progression    Baseline  77 AAROM    Time  4    Period  Weeks    Status  On-going      PT SHORT TERM GOAL #3   Title  pt reduce R knee edema by >/= 1cm to promote knee mobility and reduce pain     Time  4    Period  Weeks    Status  Unable to assess        PT Long Term Goals - 03/09/17 1246      PT LONG TERM GOAL #1   Title  increaes knee mobility to >/= -2 to 120 degrees to promote functional and efficent gait pattern     Baseline  ext -10 and flexion 44    Time  8    Period  Weeks    Status  New    Target Date  05/04/17      PT LONG TERM GOAL #2   Title  pt to increase R LE strength to >/= 4/5 to promote hip/ knee stability for walking/ standing ADLs    Baseline  unable to test R knee due to limitation    Time  8    Period  Weeks    Status  New    Target Date  05/04/17      PT LONG TERM GOAL #3   Title  pt to be able to sit/ stand and walk for >/= 45 min with <=2/10 pain and no AD for functional endurnace required for ADLs     Baseline  walk/ stand 10 min with 1 crutchand sit for 5     Time  8    Period  Weeks    Status  New    Target Date  05/04/17      PT LONG TERM GOAL #4   Title   increase FOTO score to </= 45% limited to demo improvement in function    Baseline  inital FOTO 76% limited    Time  8    Period  Weeks    Status  New    Target Date  05/04/17            Plan - 04/20/17 1408    Clinical Impression Statement  AA flexion 77 degrees.  Quad lag improving.  Pain 7/10 at end of session.  he declined the need of modalities. STG #1 met.      PT Next Visit Plan  review and update HEP, knee ROM/ mobs, hip / ankle strengthening,  Patella tendon repair protocol. 7 weeks post op, needs to do SLR to wean from brace. light SAQ quad activation     PT Home Exercise Plan  standing weight shifting, quad set, glute set and heel slides, prone hamstring curls, sidelying hip abduction. SLR    Consulted and Agree with Plan of Care  Patient       Patient will benefit from skilled therapeutic intervention in order to improve the following deficits and impairments:     Visit Diagnosis: Chronic pain of right knee  Stiffness of right knee, not elsewhere classified  Localized edema  Other abnormalities of gait and mobility  Muscle weakness (generalized)     Problem List Patient Active Problem List   Diagnosis Date Noted  . Fracture of patella with routine healing, right, closed 02/19/2017  HARRIS,KAREN PTA 04/20/2017, 2:14 PM  [facility] [address] [place], [place], [address] Phone: [phone]   Fax:  [phone]  Name: PASHA BROAD MRN: [id number] Date of Birth: 1960/04/08

## 2017-04-21 ENCOUNTER — Ambulatory Visit: Payer: Medicaid Other | Admitting: Physical Therapy

## 2017-04-21 ENCOUNTER — Telehealth: Payer: Self-pay | Admitting: Physical Therapy

## 2017-04-21 NOTE — Telephone Encounter (Signed)
Called patient right after he cancelled his appointment due to transportation.  He had let the front desk ahead of time that this might happen. His appointment has been rescheduled.   I reviewed precautions and exercises acceptable  For now.  Patient agreed. Liz BeachKaren Harris PTA

## 2017-04-27 ENCOUNTER — Encounter: Payer: Self-pay | Admitting: Physical Therapy

## 2017-04-27 ENCOUNTER — Ambulatory Visit: Payer: Medicaid Other | Attending: Orthopaedic Surgery | Admitting: Physical Therapy

## 2017-04-27 DIAGNOSIS — R2689 Other abnormalities of gait and mobility: Secondary | ICD-10-CM | POA: Insufficient documentation

## 2017-04-27 DIAGNOSIS — M6281 Muscle weakness (generalized): Secondary | ICD-10-CM | POA: Insufficient documentation

## 2017-04-27 DIAGNOSIS — M25561 Pain in right knee: Secondary | ICD-10-CM | POA: Diagnosis not present

## 2017-04-27 DIAGNOSIS — M25661 Stiffness of right knee, not elsewhere classified: Secondary | ICD-10-CM

## 2017-04-27 DIAGNOSIS — G8929 Other chronic pain: Secondary | ICD-10-CM

## 2017-04-27 DIAGNOSIS — R600 Localized edema: Secondary | ICD-10-CM | POA: Diagnosis present

## 2017-04-27 NOTE — Therapy (Signed)
[facility] Outpatient Rehabilitation [facility] [address] [place], [place], [address] Phone: [phone]   Fax:  [phone]  Physical Therapy Treatment  Patient Details  Name: Juan Malone MRN: [id number] Date of Birth: 11/30/1960 Referring Provider: Ramond Marrow MD   Encounter Date: 04/27/2017  PT End of Session - 04/27/17 1419    Visit Number  10    Number of Visits  17    Date for PT Re-Evaluation  05/04/17    Authorization Type  Self pay (awating disability)    PT Start Time  1419    PT Stop Time  1458    PT Time Calculation (min)  39 min    Activity Tolerance  Patient tolerated treatment well    Behavior During Therapy  [facility] for tasks assessed/performed       Past Medical History:  Diagnosis Date  . Asthma   . PONV (postoperative nausea and vomiting)     Past Surgical History:  Procedure Laterality Date  . HAND SURGERY    . ORIF PATELLA Right 02/23/2017   Procedure: OPEN REDUCTION INTERNAL (ORIF) FIXATION PATELLA;  Surgeon: Bjorn Pippin, MD;  [facility];  Service: Orthopedics;  Laterality: Right;  . SHOULDER SURGERY      There were no vitals filed for this visit.  Subjective Assessment - 04/27/17 1419    Subjective  Denies pain this afternoon. Not walking with AD at home.     Patient Stated Goals  to be able walk again, return to work     Currently in Pain?  No/denies                      [facility] Adult PT Treatment/Exercise - 04/27/17 0001      Knee/Hip Exercises: Stretches   Knee: Self-Stretch to increase Flexion  Other (comment);Right heel slides with strap      Knee/Hip Exercises: Aerobic   Nustep  5 min L1, 1 min with UE assist 4 without      Knee/Hip Exercises: Standing   Heel Raises  Both;20 reps    Functional Squat  Other (comment) mini squats    Gait Training  with cane, toe off knee flexion in swing through    Other Standing Knee Exercises  A/P & lateral weight shift      Knee/Hip  Exercises: Supine   Straight Leg Raises  20 reps cues for quad lag x2 in session             PT Education - 04/27/17 1446    Education provided  Yes    Education Details  scar mobilization, quad lag, gait pattern, exercise form/rationale    Person(s) Educated  Patient    Methods  Explanation;Demonstration;Tactile cues;Verbal cues    Comprehension  Verbalized understanding;Need further instruction;Returned demonstration;Verbal cues required;Tactile cues required       PT Short Term Goals - 04/20/17 1409      PT SHORT TERM GOAL #1   Title  pt to be I with initial HEP     Baseline  independent    Time  4    Period  Weeks    Status  Achieved      PT SHORT TERM GOAL #2   Title  pt to increase knee flexion to >/= 90 degrees to promote functional therapuetic progression    Baseline  77 AAROM    Time  4    Period  Weeks    Status  On-going      PT SHORT TERM GOAL #3   Title  pt reduce R knee edema by >/= 1cm to promote knee mobility and reduce pain     Time  4    Period  Weeks    Status  Unable to assess        PT Long Term Goals - 03/09/17 1246      PT LONG TERM GOAL #1   Title  increaes knee mobility to >/= -2 to 120 degrees to promote functional and efficent gait pattern     Baseline  ext -10 and flexion 44    Time  8    Period  Weeks    Status  New    Target Date  05/04/17      PT LONG TERM GOAL #2   Title  pt to increase R LE strength to >/= 4/5 to promote hip/ knee stability for walking/ standing ADLs    Baseline  unable to test R knee due to limitation    Time  8    Period  Weeks    Status  New    Target Date  05/04/17      PT LONG TERM GOAL #3   Title  pt to be able to sit/ stand and walk for >/= 45 min with <=2/10 pain and no AD for functional endurnace required for ADLs     Baseline  walk/ stand 10 min with 1 crutchand sit for 5     Time  8    Period  Weeks    Status  New    Target Date  05/04/17      PT LONG TERM GOAL #4   Title  increase  FOTO score to </= 45% limited to demo improvement in function    Baseline  inital FOTO 76% limited    Time  8    Period  Weeks    Status  New    Target Date  05/04/17            Plan - 04/27/17 1502    Clinical Impression Statement  Knee flexion AA to 78 today with notable end range stretch. Has not been using overpressure at home and educated on proper way to do so. I asked pt to transition to cane rather than crutch but to remain in brace due to notable quad lag (<5deg) and reported buckling that occurs occasionally. Added mini squats and weight shifting for CKC strength challenges, pt denied pain.     PT Treatment/Interventions  ADLs/Self Care Home Management;Electrical Stimulation;Iontophoresis 4mg /ml Dexamethasone;Moist Heat;Ultrasound;Gait training;Stair training;Therapeutic activities;Therapeutic exercise;Manual techniques;Taping;Passive range of motion;Scar mobilization;Balance training;Neuromuscular re-education;Cryotherapy;Vasopneumatic Device    PT Next Visit Plan  continue with CKC strengthening, 120 deg by 12 weeks (9weeks on 2/5), gait training     PT Home Exercise Plan  standing weight shifting, quad set, glute set and heel slides, prone hamstring curls, sidelying hip abduction. SLR    Consulted and Agree with Plan of Care  Patient       Patient will benefit from skilled therapeutic intervention in order to improve the following deficits and impairments:  Pain, Abnormal gait, Decreased endurance, Decreased activity tolerance, Decreased balance, Decreased range of motion, Impaired flexibility, Difficulty walking, Decreased strength, Decreased scar mobility, Improper body mechanics, Increased edema  Visit Diagnosis: Chronic pain of right knee  Stiffness of right knee, not elsewhere classified  Localized edema     Problem List Patient Active Problem List  Diagnosis Date Noted  . Fracture of patella with routine healing, right, closed 02/19/2017    Juan Gibby C.  Juan Malone PT, DPT 04/27/17 3:05 PM   [facility] Outpatient Rehabilitation [facility] [address] [place], [place], [address] Phone: [phone]   Fax:  [phone]  Name: Juan Malone MRN: [id number] Date of Birth: 02/10/1961

## 2017-05-03 ENCOUNTER — Encounter: Payer: Self-pay | Admitting: Physical Therapy

## 2017-05-03 ENCOUNTER — Ambulatory Visit: Payer: Medicaid Other | Admitting: Physical Therapy

## 2017-05-03 DIAGNOSIS — R600 Localized edema: Secondary | ICD-10-CM

## 2017-05-03 DIAGNOSIS — M25661 Stiffness of right knee, not elsewhere classified: Secondary | ICD-10-CM

## 2017-05-03 DIAGNOSIS — G8929 Other chronic pain: Secondary | ICD-10-CM

## 2017-05-03 DIAGNOSIS — M6281 Muscle weakness (generalized): Secondary | ICD-10-CM

## 2017-05-03 DIAGNOSIS — R2689 Other abnormalities of gait and mobility: Secondary | ICD-10-CM

## 2017-05-03 DIAGNOSIS — M25561 Pain in right knee: Principal | ICD-10-CM

## 2017-05-03 NOTE — Therapy (Signed)
[place], [place], [address] Phone: [phone]   Fax:  [phone]  Physical Therapy Treatment  Patient Details  Name: Juan Malone MRN: [id number] Date of Birth: 27-Oct-1960 Referring Provider: Ophelia Charter MD   Encounter Date: 05/03/2017  PT End of Session - 05/03/17 1204    Visit Number  11    Number of Visits  17    Date for PT Re-Evaluation  05/04/17    PT Start Time  1105    PT Stop Time  1145    PT Time Calculation (min)  40 min    Activity Tolerance  Patient tolerated treatment well    Behavior During Therapy  [facility] for tasks assessed/performed       Past Medical History:  Diagnosis Date  . Asthma   . PONV (postoperative nausea and vomiting)     Past Surgical History:  Procedure Laterality Date  . HAND SURGERY    . ORIF PATELLA Right 02/23/2017   Procedure: OPEN REDUCTION INTERNAL (ORIF) FIXATION PATELLA;  Surgeon: Hiram Gash, MD;  Location: [place];  Service: Orthopedics;  Laterality: Right;  . SHOULDER SURGERY      There were no vitals filed for this visit.  Subjective Assessment - 05/03/17 1104    Subjective  Pain 5-6/10.  I ahve been working on the squats a lot.  I see the MD tomorrow.  Less sharp pain under knee cap noted.    Currently in Pain?  Yes    Pain Score  6     Pain Location  Knee    Pain Orientation  Right;Anterior    Pain Descriptors / Indicators  Tightness;Throbbing;Sharp    Aggravating Factors   AM sharp pain,  walking too much    Pain Relieving Factors  ice ,  elevation.      Multiple Pain Sites  -- at night both hips spasm ,  twitch,  get numb, tingles and dull pain,  each hip at nigth also happens with sitting more than 15 minutes,          [facility] PT Assessment - 05/03/17 0001      Circumferential Edema   Circumferential - Right  35.5 cm at joint line      AROM   Right Knee Flexion  90                  [facility] Adult PT  Treatment/Exercise - 05/03/17 0001      Knee/Hip Exercises: Aerobic   Nustep  5 min L1, 1 min with  LE only,  lower seat  X 1      Knee/Hip Exercises: Standing   Heel Raises  Both;20 reps   no pain    Functional Squat  Other (comment) mini squats    Other Standing Knee Exercises  weight shifts with 1 ste    Other Standing Knee Exercises  standing at sink lift foot to tap step,  alterantion 10 x       Knee/Hip Exercises: Supine   Quad Sets  20 reps cued for knee cap to slide,  better with increased reps.    Heel Slides  2 sets    Straight Leg Raises  20 reps cues for technique             PT Education - 05/03/17 1204    Education provided  No       PT Short Term Goals - 05/03/17 1132  PT SHORT TERM GOAL #1   Title  pt to be I with initial HEP     Baseline  independent    Time  4    Period  Weeks    Status  Achieved      PT SHORT TERM GOAL #2   Title  pt to increase knee flexion to >/= 90 degrees to promote functional therapuetic progression    Baseline  90    Time  4    Period  Weeks    Status  Achieved      PT SHORT TERM GOAL #3   Title  pt reduce R knee edema by >/= 1cm to promote knee mobility and reduce pain     Baseline  35.5    Time  4    Period  Weeks    Status  Achieved        PT Long Term Goals - 05/03/17 1201      PT LONG TERM GOAL #1   Title  increaes knee mobility to >/= -2 to 120 degrees to promote functional and efficent gait pattern     Baseline  90 degrees AROM flexion       PT LONG TERM GOAL #2   Title  pt to increase R LE strength to >/= 4/5 to promote hip/ knee stability for walking/ standing ADLs    Baseline  unable to test R knee due to limitation    Time  8    Period  Weeks    Status  Unable to assess      PT LONG TERM GOAL #3   Title  pt to be able to sit/ stand and walk for >/= 45 min with <=2/10 pain and no AD for functional endurnace required for ADLs     Baseline  Sit  tolerated 15 minutes (Then hip right goes numb),   stand 10 to 15 minutes,  walking 10 to 15 minutes with pain 8-10/10      PT LONG TERM GOAL #4   Title  increase FOTO score to </= 45% limited to demo improvement in function    Baseline  67% limitation    Time  8    Period  Weeks    Status  On-going            Plan - 05/03/17 1205    Clinical Impression Statement  All STG's met  . AROM 90 degrees,  FOTO improved to 67 % limitation.  No pain at end of session.  patient declined the need of modalities.    PT Next Visit Plan  continue with CKC strengthening, 120 deg by 12 weeks (9weeks on 2/5), gait training See what md says.    PT Home Exercise Plan  standing weight shifting, quad set, glute set and heel slides, prone hamstring curls, sidelying hip abduction. SLR    Consulted and Agree with Plan of Care  Patient       Patient will benefit from skilled therapeutic intervention in order to improve the following deficits and impairments:     Visit Diagnosis: Chronic pain of right knee  Stiffness of right knee, not elsewhere classified  Localized edema  Other abnormalities of gait and mobility  Muscle weakness (generalized)     Problem List Patient Active Problem List   Diagnosis Date Noted  . Fracture of patella with routine healing, right, closed 02/19/2017    Juan Malone  PTA 05/03/2017, 12:08 PM  [facility]  [place], [place], [address] Phone: [phone]   Fax:  [phone]  Name: Juan Malone MRN: [id number] Date of Birth: 01-Oct-1960

## 2017-05-06 ENCOUNTER — Encounter: Payer: Self-pay | Admitting: Physical Therapy

## 2017-05-06 ENCOUNTER — Ambulatory Visit: Payer: Medicaid Other | Admitting: Physical Therapy

## 2017-05-06 DIAGNOSIS — R600 Localized edema: Secondary | ICD-10-CM

## 2017-05-06 DIAGNOSIS — R2689 Other abnormalities of gait and mobility: Secondary | ICD-10-CM

## 2017-05-06 DIAGNOSIS — M25661 Stiffness of right knee, not elsewhere classified: Secondary | ICD-10-CM

## 2017-05-06 DIAGNOSIS — M25561 Pain in right knee: Secondary | ICD-10-CM | POA: Diagnosis not present

## 2017-05-06 DIAGNOSIS — M6281 Muscle weakness (generalized): Secondary | ICD-10-CM

## 2017-05-06 DIAGNOSIS — G8929 Other chronic pain: Secondary | ICD-10-CM

## 2017-05-06 NOTE — Therapy (Signed)
[facility] Outpatient Rehabilitation [facility] [address] [place], [place], [address] Phone: [phone]   Fax:  [phone]  Physical Therapy Treatment  Patient Details  Name: Juan Malone MRN: [id number] Date of Birth: [birth year] Referring Provider: Ramond Marrow MD   Encounter Date: 05/06/2017  PT End of Session - 05/06/17 1142    Visit Number  12    Number of Visits  17    Date for PT Re-Evaluation  05/04/17    Authorization Type  Self pay (awating disability)    PT Start Time  1100    PT Stop Time  1144    PT Time Calculation (min)  44 min    Activity Tolerance  Patient tolerated treatment well    Behavior During Therapy  [facility] for tasks assessed/performed       Past Medical History:  Diagnosis Date  . Asthma   . PONV (postoperative nausea and vomiting)     Past Surgical History:  Procedure Laterality Date  . HAND SURGERY    . ORIF PATELLA Right 02/23/2017   Procedure: OPEN REDUCTION INTERNAL (ORIF) FIXATION PATELLA;  Surgeon: Bjorn Pippin, MD;  Location: [facility];  Service: Orthopedics;  Laterality: Right;  . SHOULDER SURGERY      There were no vitals filed for this visit.  Subjective Assessment - 05/06/17 1104    Subjective  MD is pleased with progress; in brace until he sees doc 3/26.      Patient Stated Goals  to be able walk again, return to work     Currently in Pain?  Yes    Pain Score  6     Pain Location  Knee    Pain Orientation  Right;Anterior    Pain Descriptors / Indicators  Tightness;Throbbing;Sharp    Pain Type  Surgical pain    Pain Onset  More than a month ago    Pain Frequency  Intermittent    Aggravating Factors   AM sharp pain, walking too much    Pain Relieving Factors  ice, elevation                      [facility] Adult PT Treatment/Exercise - 05/06/17 1105      Knee/Hip Exercises: Stretches   Lobbyist  Right;5 reps;30 seconds prone with strap      Knee/Hip Exercises: Aerobic   Recumbent Bike  partial revolutions x 8 min      Knee/Hip Exercises: Standing   Heel Raises  Both;20 reps fosus on RLE gastroc activation    Forward Step Up  Right;10 reps;2 sets;Hand Hold: 2;Step Height: 8" cues to decrease hip hike; and quad control      Knee/Hip Exercises: Supine   Quad Sets  20 reps    Short Arc Quad Sets  Strengthening;Right;20 reps 2# with 5 sec hold; cues for quad control    Heel Slides  AAROM;20 reps    Straight Leg Raises  20 reps cues for technique               PT Short Term Goals - 05/03/17 1132      PT SHORT TERM GOAL #1   Title  pt to be I with initial HEP     Baseline  independent    Time  4    Period  Weeks    Status  Achieved      PT SHORT TERM GOAL #2   Title  pt to increase  knee flexion to >/= 90 degrees to promote functional therapuetic progression    Baseline  90    Time  4    Period  Weeks    Status  Achieved      PT SHORT TERM GOAL #3   Title  pt reduce R knee edema by >/= 1cm to promote knee mobility and reduce pain     Baseline  35.5    Time  4    Period  Weeks    Status  Achieved        PT Long Term Goals - 05/03/17 1201      PT LONG TERM GOAL #1   Title  increaes knee mobility to >/= -2 to 120 degrees to promote functional and efficent gait pattern     Baseline  90 degrees AROM flexion       PT LONG TERM GOAL #2   Title  pt to increase R LE strength to >/= 4/5 to promote hip/ knee stability for walking/ standing ADLs    Baseline  unable to test R knee due to limitation    Time  8    Period  Weeks    Status  Unable to assess      PT LONG TERM GOAL #3   Title  pt to be able to sit/ stand and walk for >/= 45 min with <=2/10 pain and no AD for functional endurnace required for ADLs     Baseline  Sit  tolerated 15 minutes (Then hip right goes numb),  stand 10 to 15 minutes,  walking 10 to 15 minutes with pain 8-10/10      PT LONG TERM GOAL #4   Title  increase FOTO score to </= 45% limited to demo improvement in  function    Baseline  67% limitation    Time  8    Period  Weeks    Status  On-going            Plan - 05/06/17 1143    Clinical Impression Statement  Pt tolerated session well today with improved quad control today.  Progressing well with PT.    PT Treatment/Interventions  ADLs/Self Care Home Management;Electrical Stimulation;Iontophoresis 4mg /ml Dexamethasone;Moist Heat;Ultrasound;Gait training;Stair training;Therapeutic activities;Therapeutic exercise;Manual techniques;Taping;Passive range of motion;Scar mobilization;Balance training;Neuromuscular re-education;Cryotherapy;Vasopneumatic Device    PT Next Visit Plan  continue with CKC strengthening, 120 deg by 12 weeks (9weeks on 2/5), gait training See what md says.    Consulted and Agree with Plan of Care  Patient       Patient will benefit from skilled therapeutic intervention in order to improve the following deficits and impairments:  Pain, Abnormal gait, Decreased endurance, Decreased activity tolerance, Decreased balance, Decreased range of motion, Impaired flexibility, Difficulty walking, Decreased strength, Decreased scar mobility, Improper body mechanics, Increased edema  Visit Diagnosis: Chronic pain of right knee  Stiffness of right knee, not elsewhere classified  Localized edema  Other abnormalities of gait and mobility  Muscle weakness (generalized)     Problem List Patient Active Problem List   Diagnosis Date Noted  . Fracture of patella with routine healing, right, closed 02/19/2017      Clarita CraneStephanie F Vickee Mormino, PT, DPT 05/06/17 11:44 AM    [facility] [address] [place], [place], [address] Phone: [phone]   Fax:  [phone]  Name: Benedetto Coonsimothy V Gosselin MRN: [id number] Date of Birth: Jun 14, 1960

## 2017-05-10 ENCOUNTER — Ambulatory Visit: Payer: Medicaid Other | Admitting: Physical Therapy

## 2017-05-10 ENCOUNTER — Encounter: Payer: Self-pay | Admitting: Physical Therapy

## 2017-05-10 DIAGNOSIS — M25561 Pain in right knee: Principal | ICD-10-CM

## 2017-05-10 DIAGNOSIS — R2689 Other abnormalities of gait and mobility: Secondary | ICD-10-CM

## 2017-05-10 DIAGNOSIS — R600 Localized edema: Secondary | ICD-10-CM

## 2017-05-10 DIAGNOSIS — M6281 Muscle weakness (generalized): Secondary | ICD-10-CM

## 2017-05-10 DIAGNOSIS — M25661 Stiffness of right knee, not elsewhere classified: Secondary | ICD-10-CM

## 2017-05-10 DIAGNOSIS — G8929 Other chronic pain: Secondary | ICD-10-CM

## 2017-05-10 NOTE — Therapy (Signed)
[facility] Outpatient Rehabilitation [facility] [address] [place], [place], [address] Phone: [phone]   Fax:  [phone]  Physical Therapy Treatment / Re-certification  Patient Details  Name: Juan Malone MRN: [id number] Date of Birth: [birth year] Referring Provider: Ramond Marrow MD   Encounter Date: 05/10/2017  PT End of Session - 05/10/17 1101    Visit Number  13    Number of Visits  21    Date for PT Re-Evaluation  06/21/17    Authorization Type  Self pay (awating disability)    PT Start Time  1103    PT Stop Time  1145    PT Time Calculation (min)  42 min    Activity Tolerance  Patient tolerated treatment well    Behavior During Therapy  [facility] for tasks assessed/performed       Past Medical History:  Diagnosis Date  . Asthma   . PONV (postoperative nausea and vomiting)     Past Surgical History:  Procedure Laterality Date  . HAND SURGERY    . ORIF PATELLA Right 02/23/2017   Procedure: OPEN REDUCTION INTERNAL (ORIF) FIXATION PATELLA;  Surgeon: Bjorn Pippin, MD;  Location: [facility];  Service: Orthopedics;  Laterality: Right;  . SHOULDER SURGERY      There were no vitals filed for this visit.  Subjective Assessment - 05/10/17 1106    Subjective  " I am doing pretty good, still some soreness and pain fluctuates up around the knee up to 7-8/10 and it feels sharp"    How long can you stand comfortably?  15-20 min     How long can you walk comfortably?  15-20 min with crutch    Patient Stated Goals  to be able walk again, return to work     Currently in Pain?  Yes    Pain Score  6     Pain Orientation  Right;Anterior    Pain Descriptors / Indicators  Aching    Pain Onset  More than a month ago    Pain Frequency  Intermittent    Aggravating Factors   walking, standing     Pain Relieving Factors  ice, elevation          [facility] PT Assessment - 05/10/17 1108      AROM   Right Knee Extension  -4    Right Knee Flexion  90 95  after manual      PROM   Right Knee Flexion  99                  [facility] Adult PT Treatment/Exercise - 05/10/17 1130      Knee/Hip Exercises: Stretches   Quad Stretch  Right;5 reps;30 seconds PNF: contract/ relax with 10 sec contraction      Knee/Hip Exercises: Aerobic   Nustep  L5 x 5 min LE only      Knee/Hip Exercises: Standing   Heel Raises  Both;20 reps    Forward Step Up  Right;10 reps;2 sets;Hand Hold: 2;Step Height: 2"    Step Down  Step Height: 2";Hand Hold: 2;10 reps;2 sets;Right    Functional Squat  2 sets;15 reps cues for knee/ foot alignment      Knee/Hip Exercises: Supine   Quad Sets  20 reps    Heel Slides  AAROM;20 reps      Manual Therapy   Manual Therapy  Joint mobilization    Manual therapy comments  MTPR along the rectus femoris x 3,  DTM using tennis ball    Joint Mobilization  grade 3-4 AP tibiofemoral, and patellar mobs in all planes             PT Education - 05/10/17 1204    Education provided  Yes    Education Details  reviewed previously provided HEP and MTPR along the quad using a tennis ball.    Person(s) Educated  Patient    Methods  Explanation;Demonstration    Comprehension  Verbalized understanding;Returned demonstration       PT Short Term Goals - 05/10/17 1113      PT SHORT TERM GOAL #1   Title  pt to be I with initial HEP     Time  4    Period  Weeks    Status  Achieved      PT SHORT TERM GOAL #2   Title  pt to increase knee flexion to >/= 90 degrees to promote functional therapuetic progression    Time  4    Period  Weeks    Status  Achieved      PT SHORT TERM GOAL #3   Title  pt reduce R knee edema by >/= 1cm to promote knee mobility and reduce pain     Time  4    Period  Weeks    Status  Achieved        PT Long Term Goals - 05/10/17 1113      PT LONG TERM GOAL #1   Title  increaes knee mobility to >/= -2 to 120 degrees to promote functional and efficent gait pattern     Baseline  -4 to 90 degreess     Time  4    Period  Weeks    Status  On-going    Target Date  06/07/17      PT LONG TERM GOAL #2   Title  pt to increase R LE strength to >/= 4/5 to promote hip/ knee stability for walking/ standing ADLs    Baseline  unable to test R knee due to limitation    Time  4    Status  On-going      PT LONG TERM GOAL #3   Title  pt to be able to sit/ stand and walk for >/= 45 min with <=2/10 pain and no AD for functional endurnace required for ADLs     Baseline  Sit  tolerated 15 minutes (Then hip right goes numb),  stand 10 to 15 minutes,  walking 10 to 15 minutes with pain 8-10/10    Time  8    Period  Weeks    Status  On-going      PT LONG TERM GOAL #4   Title  increase FOTO score to </= 45% limited to demo improvement in function    Time  8    Period  Weeks    Status  On-going            Plan - 05/10/17 1206    Clinical Impression Statement  Pt conitnues to report 7-8/10 pain with weightbearing and exercise in the anterior aspect of the knee. He progressed following manual to -4 to 95 degrees. continues progression per protocol with CKC strengthening in brace which he is performing well requiring verbal cues for proper form. plan to conitnued current POC progressing with strengthening and ROM of the R knee to maximize function.     PT Frequency  2x / week    PT Duration  6 weeks potential progress to 1 x week starting March    PT Treatment/Interventions  ADLs/Self Care Home Management;Electrical Stimulation;Iontophoresis 4mg /ml Dexamethasone;Moist Heat;Ultrasound;Gait training;Stair training;Therapeutic activities;Therapeutic exercise;Manual techniques;Taping;Passive range of motion;Scar mobilization;Balance training;Neuromuscular re-education;Cryotherapy;Vasopneumatic Device    PT Next Visit Plan  continue/progress with CKC strengthening, 120 deg by 12 weeks (11weeks on 2/18), gait training,     PT Home Exercise Plan  standing weight shifting, quad set, glute set and heel slides,  prone hamstring curls, sidelying hip abduction. SLR    Consulted and Agree with Plan of Care  Patient       Patient will benefit from skilled therapeutic intervention in order to improve the following deficits and impairments:  Pain, Abnormal gait, Decreased endurance, Decreased activity tolerance, Decreased balance, Decreased range of motion, Impaired flexibility, Difficulty walking, Decreased strength, Decreased scar mobility, Improper body mechanics, Increased edema  Visit Diagnosis: Chronic pain of right knee  Stiffness of right knee, not elsewhere classified  Localized edema  Other abnormalities of gait and mobility  Muscle weakness (generalized)     Problem List Patient Active Problem List   Diagnosis Date Noted  . Fracture of patella with routine healing, right, closed 02/19/2017   Juan Malone PT, DPT, LAT, ATC  05/10/17  12:17 PM      [facility] [address] [place], [place], [address] Phone: [phone]   Fax:  [phone]  Name: Juan Malone MRN: [id number] Date of Birth: Dec 29, 1960

## 2017-05-13 ENCOUNTER — Ambulatory Visit: Payer: Medicaid Other | Admitting: Physical Therapy

## 2017-05-13 ENCOUNTER — Encounter: Payer: Self-pay | Admitting: Physical Therapy

## 2017-05-13 DIAGNOSIS — M25561 Pain in right knee: Principal | ICD-10-CM

## 2017-05-13 DIAGNOSIS — R600 Localized edema: Secondary | ICD-10-CM

## 2017-05-13 DIAGNOSIS — R2689 Other abnormalities of gait and mobility: Secondary | ICD-10-CM

## 2017-05-13 DIAGNOSIS — M6281 Muscle weakness (generalized): Secondary | ICD-10-CM

## 2017-05-13 DIAGNOSIS — G8929 Other chronic pain: Secondary | ICD-10-CM

## 2017-05-13 DIAGNOSIS — M25661 Stiffness of right knee, not elsewhere classified: Secondary | ICD-10-CM

## 2017-05-13 NOTE — Therapy (Signed)
[facility] [address] [place], [place], [address] Phone: [phone]   Fax:  [phone]  Physical Therapy Treatment  Patient Details  Name: Juan Malone MRN: [id number] Date of Birth: 1960-12-14 Referring Provider: Ramond Marrow MD   Encounter Date: 05/13/2017  PT End of Session - 05/13/17 1104    Visit Number  14    Number of Visits  21    Date for PT Re-Evaluation  06/21/17    Authorization Type  Self pay (awating disability)    PT Start Time  1100    PT Stop Time  1145    PT Time Calculation (min)  45 min       Past Medical History:  Diagnosis Date  . Asthma   . PONV (postoperative nausea and vomiting)     Past Surgical History:  Procedure Laterality Date  . HAND SURGERY    . ORIF PATELLA Right 02/23/2017   Procedure: OPEN REDUCTION INTERNAL (ORIF) FIXATION PATELLA;  Surgeon: Bjorn Pippin, MD;  Location: [facility];  Service: Orthopedics;  Laterality: Right;  . SHOULDER SURGERY      There were no vitals filed for this visit.  Subjective Assessment - 05/13/17 1103    Subjective  7-8/10. Rainy weather does not help.     Currently in Pain?  Yes    Pain Score  7  7 to 8    Pain Location  Knee    Pain Orientation  Right;Anterior    Pain Descriptors / Indicators  Sharp;Aching pulling                      [facility] Adult PT Treatment/Exercise - 05/13/17 0001      Knee/Hip Exercises: Aerobic   Recumbent Bike  full revolutions       Knee/Hip Exercises: Standing   Heel Raises  Both;20 reps    Forward Step Up  Right;10 reps;2 sets;Hand Hold: 2;Step Height: 4"    Functional Squat  15 reps      Knee/Hip Exercises: Seated   Long Arc Quad  20 reps      Knee/Hip Exercises: Supine   Short Arc Quad Sets  20 reps 2# 10 sec hold     Heel Slides  AAROM;20 reps    Straight Leg Raises  10 reps cues for technique      Manual Therapy   Joint Mobilization  grade 3-4 AP tibiofemoral, and  patellar mobs in all planes               PT Short Term Goals - 05/10/17 1113      PT SHORT TERM GOAL #1   Title  pt to be I with initial HEP     Time  4    Period  Weeks    Status  Achieved      PT SHORT TERM GOAL #2   Title  pt to increase knee flexion to >/= 90 degrees to promote functional therapuetic progression    Time  4    Period  Weeks    Status  Achieved      PT SHORT TERM GOAL #3   Title  pt reduce R knee edema by >/= 1cm to promote knee mobility and reduce pain     Time  4    Period  Weeks    Status  Achieved        PT Long Term Goals - 05/10/17 1113  PT LONG TERM GOAL #1   Title  increaes knee mobility to >/= -2 to 120 degrees to promote functional and efficent gait pattern     Baseline  -4 to 90 degreess    Time  4    Period  Weeks    Status  On-going    Target Date  06/07/17      PT LONG TERM GOAL #2   Title  pt to increase R LE strength to >/= 4/5 to promote hip/ knee stability for walking/ standing ADLs    Baseline  unable to test R knee due to limitation    Time  4    Status  On-going      PT LONG TERM GOAL #3   Title  pt to be able to sit/ stand and walk for >/= 45 min with <=2/10 pain and no AD for functional endurnace required for ADLs     Baseline  Sit  tolerated 15 minutes (Then hip right goes numb),  stand 10 to 15 minutes,  walking 10 to 15 minutes with pain 8-10/10    Time  8    Period  Weeks    Status  On-going      PT LONG TERM GOAL #4   Title  increase FOTO score to </= 45% limited to demo improvement in function    Time  8    Period  Weeks    Status  On-going            Plan - 05/13/17 1156    Clinical Impression Statement  Pain levels unchanged reported at 7-8/10. He states MD said there is a stabilizer string in his knee that may need to be removed if it continues to bother him. His AROM after bike was 95 and improved to 98 after mobs and stretching. Quad lag improving. Pt brought walking stick that is too  high for him to use safely as AD. Recommend he check local [employer] / thrift store for adjustable SPC.     PT Next Visit Plan  FOTO next week; pt may have medicare or medicad? will bring card when he gets it. continue/progress with CKC strengthening, 120 deg by 12 weeks (11weeks on 2/18), gait training,     PT Home Exercise Plan  standing weight shifting, quad set, glute set and heel slides, prone hamstring curls, sidelying hip abduction. SLR    Consulted and Agree with Plan of Care  Patient       Patient will benefit from skilled therapeutic intervention in order to improve the following deficits and impairments:  Pain, Abnormal gait, Decreased endurance, Decreased activity tolerance, Decreased balance, Decreased range of motion, Impaired flexibility, Difficulty walking, Decreased strength, Decreased scar mobility, Improper body mechanics, Increased edema  Visit Diagnosis: Chronic pain of right knee  Stiffness of right knee, not elsewhere classified  Localized edema  Other abnormalities of gait and mobility  Muscle weakness (generalized)     Problem List Patient Active Problem List   Diagnosis Date Noted  . Fracture of patella with routine healing, right, closed 02/19/2017    Sherrie Mustacheonoho, Wolf Boulay McGee, PTA 05/13/2017, 12:06 PM  [facility] [address] [place], [place], [address] Phone: [phone]   Fax:  [phone]  Name: Juan Malone MRN: [id number] Date of Birth: 1961-02-05

## 2017-05-17 ENCOUNTER — Ambulatory Visit: Payer: Medicaid Other | Admitting: Physical Therapy

## 2017-05-17 ENCOUNTER — Encounter: Payer: Self-pay | Admitting: Physical Therapy

## 2017-05-17 DIAGNOSIS — R600 Localized edema: Secondary | ICD-10-CM

## 2017-05-17 DIAGNOSIS — R2689 Other abnormalities of gait and mobility: Secondary | ICD-10-CM

## 2017-05-17 DIAGNOSIS — M25561 Pain in right knee: Principal | ICD-10-CM

## 2017-05-17 DIAGNOSIS — M25661 Stiffness of right knee, not elsewhere classified: Secondary | ICD-10-CM

## 2017-05-17 DIAGNOSIS — M6281 Muscle weakness (generalized): Secondary | ICD-10-CM

## 2017-05-17 DIAGNOSIS — G8929 Other chronic pain: Secondary | ICD-10-CM

## 2017-05-17 NOTE — Therapy (Signed)
[facility] [address] [place], [place], [address] Phone: [phone]   Fax:  [phone]  Physical Therapy Treatment  Patient Details  Name: Juan Malone MRN: [id number] Date of Birth: 1960-09-12 Referring Provider: Ramond Marrowax Varkey MD   Encounter Date: 05/17/2017  PT End of Session - 05/17/17 1101    Visit Number  15    Number of Visits  21    Date for PT Re-Evaluation  06/21/17    PT Start Time  1105    PT Stop Time  1145    PT Time Calculation (min)  40 min    Activity Tolerance  Patient tolerated treatment well    Behavior During Therapy  [facility] for tasks assessed/performed       Past Medical History:  Diagnosis Date  . Asthma   . PONV (postoperative nausea and vomiting)     Past Surgical History:  Procedure Laterality Date  . HAND SURGERY    . ORIF PATELLA Right 02/23/2017   Procedure: OPEN REDUCTION INTERNAL (ORIF) FIXATION PATELLA;  Surgeon: Bjorn PippinVarkey, Dax T, MD;  Location: [facility];  Service: Orthopedics;  Laterality: Right;  . SHOULDER SURGERY      There were no vitals filed for this visit.  Subjective Assessment - 05/17/17 1026    Subjective  Pain kept me up last night hips with dull shooting pain    Currently in Pain?  Yes    Pain Score  8     Pain Location  Knee    Pain Orientation  Right;Anterior    Pain Descriptors / Indicators  Aching;Spasm;Sharp    Pain Frequency  Intermittent    Aggravating Factors   walking standing,  spasm at night     Pain Relieving Factors  ice elevation    Multiple Pain Sites  -- has some hip pain         [facility] PT Assessment - 05/17/17 0001      AROM   Right Knee Flexion  95 after manual                  [facility] Adult PT Treatment/Exercise - 05/17/17 0001      Knee/Hip Exercises: Stretches   Quad Stretch  5 reps;30 seconds with heat,  manual strumming    Hip Flexor Stretch  1 rep;30 seconds off edge of mat      Knee/Hip Exercises: Aerobic    Recumbent Bike  unable to make full revolutions      Knee/Hip Exercises: Standing   Heel Raises  Both;20 reps    Forward Step Up  Right;10 reps;2 sets;Hand Hold: 2;Step Height: 6"    Functional Squat  15 reps      Knee/Hip Exercises: Supine   Short Arc Quad Sets  10 reps;2 sets 1 set 6 inch roll , 1 set 8 inch bolster 2 LBS 10 second hol    Heel Slides  20 reps    Straight Leg Raises  10 reps      Manual Therapy   Manual therapy comments  myofascial release distal thigh/ knee.  strumming quads gentle P/A mobs with towel roll.,             PT Education - 05/17/17 1100    Education provided  No       PT Short Term Goals - 05/10/17 1113      PT SHORT TERM GOAL #1   Title  pt to be I with initial HEP  Time  4    Period  Weeks    Status  Achieved      PT SHORT TERM GOAL #2   Title  pt to increase knee flexion to >/= 90 degrees to promote functional therapuetic progression    Time  4    Period  Weeks    Status  Achieved      PT SHORT TERM GOAL #3   Title  pt reduce R knee edema by >/= 1cm to promote knee mobility and reduce pain     Time  4    Period  Weeks    Status  Achieved        PT Long Term Goals - 05/17/17 1619      PT LONG TERM GOAL #1   Title  increaes knee mobility to >/= -2 to 120 degrees to promote functional and efficent gait pattern     Baseline  95    Time  4    Period  Weeks    Status  On-going      PT LONG TERM GOAL #2   Title  pt to increase R LE strength to >/= 4/5 to promote hip/ knee stability for walking/ standing ADLs    Time  4    Status  Unable to assess      PT LONG TERM GOAL #3   Title  pt to be able to sit/ stand and walk for >/= 45 min with <=2/10 pain and no AD for functional endurnace required for ADLs     Time  8    Period  Weeks    Status  Unable to assess      PT LONG TERM GOAL #4   Title  increase FOTO score to </= 45% limited to demo improvement in function    Time  8    Period  Weeks    Status  Unable to  assess            Plan - 05/17/17 1617    Clinical Impression Statement  95 Knee flexion after exercise/ manual.  No pain increased at end of session.  patient declined the need for modalities.  Strengthening focus today.  Slight quad lag noted,  improving.  Patient brought a cane that was the correct height.    PT Next Visit Plan  FOTO next week; pt may have medicare or medicad? will bring card when he gets it. continue/progress with CKC strengthening, 120 deg by 12 weeks (11weeks on 2/18), gait training,     PT Home Exercise Plan  standing weight shifting, quad set, glute set and heel slides, prone hamstring curls, sidelying hip abduction. SLR    Consulted and Agree with Plan of Care  Patient       Patient will benefit from skilled therapeutic intervention in order to improve the following deficits and impairments:     Visit Diagnosis: Chronic pain of right knee  Stiffness of right knee, not elsewhere classified  Localized edema  Other abnormalities of gait and mobility  Muscle weakness (generalized)     Problem List Patient Active Problem List   Diagnosis Date Noted  . Fracture of patella with routine healing, right, closed 02/19/2017    HARRIS,KAREN   PTA 05/17/2017, 4:22 PM  [facility] [address] [place], [place], [address] Phone: [phone]   Fax:  [phone]  Name: Juan Malone MRN: [id number] Date of Birth: Sep 02, 1960

## 2017-05-17 NOTE — Patient Instructions (Signed)
Keep up the good work

## 2017-05-20 ENCOUNTER — Ambulatory Visit: Payer: Medicaid Other | Admitting: Physical Therapy

## 2017-05-20 ENCOUNTER — Encounter: Payer: Self-pay | Admitting: Physical Therapy

## 2017-05-20 DIAGNOSIS — G8929 Other chronic pain: Secondary | ICD-10-CM

## 2017-05-20 DIAGNOSIS — M25661 Stiffness of right knee, not elsewhere classified: Secondary | ICD-10-CM

## 2017-05-20 DIAGNOSIS — M6281 Muscle weakness (generalized): Secondary | ICD-10-CM

## 2017-05-20 DIAGNOSIS — R2689 Other abnormalities of gait and mobility: Secondary | ICD-10-CM

## 2017-05-20 DIAGNOSIS — M25561 Pain in right knee: Principal | ICD-10-CM

## 2017-05-20 DIAGNOSIS — R600 Localized edema: Secondary | ICD-10-CM

## 2017-05-20 NOTE — Therapy (Signed)
[facility] [address] [place], [place], [address] Phone: [phone]   Fax:  [phone]  Physical Therapy Treatment  Patient Details  Name: Juan Malone MRN: [id number] Date of Birth: [birth year] Referring Provider: Ramond Marrow MD   Encounter Date: 05/20/2017  PT End of Session - 05/20/17 1120    Visit Number  16    Number of Visits  21    Date for PT Re-Evaluation  06/21/17    Authorization Type  Self pay (awating disability)    PT Start Time  1058    PT Stop Time  1148    PT Time Calculation (min)  50 min    Activity Tolerance  Patient tolerated treatment well    Behavior During Therapy  [facility] for tasks assessed/performed       Past Medical History:  Diagnosis Date  . Asthma   . PONV (postoperative nausea and vomiting)     Past Surgical History:  Procedure Laterality Date  . HAND SURGERY    . ORIF PATELLA Right 02/23/2017   Procedure: OPEN REDUCTION INTERNAL (ORIF) FIXATION PATELLA;  Surgeon: Bjorn Pippin, MD;  Location: [facility];  Service: Orthopedics;  Laterality: Right;  . SHOULDER SURGERY      There were no vitals filed for this visit.  Subjective Assessment - 05/20/17 1115    Subjective  Pt arriving to therapy with 5/10 pain more at night and first thing in the morning. Pt still wearing his knee brace.     Diagnostic tests  x-ray 03/04/2017    Patient Stated Goals  to be able walk again, return to work     Currently in Pain?  Yes    Pain Score  5     Pain Orientation  Right;Anterior    Pain Descriptors / Indicators  Aching    Pain Type  Surgical pain    Pain Onset  More than a month ago    Pain Frequency  Intermittent    Aggravating Factors   prolonged walking, spasms at night    Pain Relieving Factors  ice         [facility] PT Assessment - 05/20/17 0001      AROM   Right Knee Flexion  102      PROM   Right Knee Flexion  105                  [facility] Adult PT  Treatment/Exercise - 05/20/17 0001      Ambulation/Gait   Gait Comments  working on heel to toe gait training with brace and no assisstive device. Pt edu in equalized step length      Knee/Hip Exercises: Stretches   Hip Flexor Stretch  3 reps;20 seconds off edge of mat    Gastroc Stretch  Right;3 reps;20 seconds      Knee/Hip Exercises: Aerobic   Recumbent Bike  pt requiring warm up before full revolution could be performed      Knee/Hip Exercises: Standing   Heel Raises  Both;20 reps    Lateral Step Up  Right;10 reps;Hand Hold: 1;Step Height: 8"    Forward Step Up  Right;2 sets;10 reps;Hand Hold: 2;Step Height: 8"    Functional Squat  15 reps    Other Standing Knee Exercises  SLS on foam mat left: 15 seconds, right: 5 seconds      Knee/Hip Exercises: Supine   Short Arc Quad Sets  10 reps;2 sets 1 set 6  inch roll , 1 set 8 inch bolster 2 LBS 10 second hol    Heel Slides  20 reps    Straight Leg Raises  3 sets;10 reps 10 x toes up, 10x with ER, 10x with IR             PT Education - 05/20/17 1119    Education Details  reviewed verbally pt's HEP    Person(s) Educated  Patient    Methods  Explanation    Comprehension  Verbalized understanding       PT Short Term Goals - 05/10/17 1113      PT SHORT TERM GOAL #1   Title  pt to be I with initial HEP     Time  4    Period  Weeks    Status  Achieved      PT SHORT TERM GOAL #2   Title  pt to increase knee flexion to >/= 90 degrees to promote functional therapuetic progression    Time  4    Period  Weeks    Status  Achieved      PT SHORT TERM GOAL #3   Title  pt reduce R knee edema by >/= 1cm to promote knee mobility and reduce pain     Time  4    Period  Weeks    Status  Achieved        PT Long Term Goals - 05/20/17 1211      PT LONG TERM GOAL #1   Title  increaes knee mobility to >/= -2 to 120 degrees to promote functional and efficent gait pattern     Baseline  95    Time  4    Period  Weeks    Status   On-going      PT LONG TERM GOAL #2   Title  pt to increase R LE strength to >/= 4/5 to promote hip/ knee stability for walking/ standing ADLs    Baseline  unable to test R knee due to limitation    Time  4    Period  Weeks    Status  Unable to assess      PT LONG TERM GOAL #3   Title  pt to be able to sit/ stand and walk for >/= 45 min with <=2/10 pain and no AD for functional endurnace required for ADLs     Baseline  Sit  tolerated 15 minutes (Then hip right goes numb),  stand 10 to 15 minutes,  walking 10 to 15 minutes with pain 8-10/10    Time  8    Period  Weeks    Status  Unable to assess      PT LONG TERM GOAL #4   Title  increase FOTO score to </= 45% limited to demo improvement in function    Baseline  67% limitation    Time  8    Period  Weeks    Status  Unable to assess            Plan - 05/20/17 1203    Clinical Impression Statement  Pt arriving to therapy with 5/10 pain in his right knee. Pt tolerated all exercises well with increased AROM to 102 degrees of knee flexion. Pt worked on Investment banker, operationalgait training with heel to toe gait pattern with no assistive device. Pt tolerating more single leg stance activities and progressing nicely. Continue skilled PT.     Rehab Potential  Good    PT  Frequency  2x / week    PT Duration  6 weeks    PT Treatment/Interventions  ADLs/Self Care Home Management;Electrical Stimulation;Iontophoresis 4mg /ml Dexamethasone;Moist Heat;Ultrasound;Gait training;Stair training;Therapeutic activities;Therapeutic exercise;Manual techniques;Taping;Passive range of motion;Scar mobilization;Balance training;Neuromuscular re-education;Cryotherapy;Vasopneumatic Device    PT Next Visit Plan  FOTO next week; pt may have medicare or medicad? will bring card when he gets it. continue/progress with CKC strengthening, 120 deg by 12 weeks (11weeks on 2/18), gait training,     PT Home Exercise Plan  standing weight shifting, quad set, glute set and heel slides, prone  hamstring curls, sidelying hip abduction. SLR    Consulted and Agree with Plan of Care  Patient       Patient will benefit from skilled therapeutic intervention in order to improve the following deficits and impairments:  Pain, Abnormal gait, Decreased endurance, Decreased activity tolerance, Decreased balance, Decreased range of motion, Impaired flexibility, Difficulty walking, Decreased strength, Decreased scar mobility, Improper body mechanics, Increased edema  Visit Diagnosis: Chronic pain of right knee  Stiffness of right knee, not elsewhere classified  Localized edema  Other abnormalities of gait and mobility  Muscle weakness (generalized)     Problem List Patient Active Problem List   Diagnosis Date Noted  . Fracture of patella with routine healing, right, closed 02/19/2017    Sharmon Leyden, MPT 05/20/2017, 12:28 PM  [facility] [address] [place], [place], [address] Phone: [phone]   Fax:  [phone]  Name: JAKARRI LESKO MRN: [id number] Date of Birth: 1960/06/02

## 2017-05-27 ENCOUNTER — Ambulatory Visit: Payer: Medicaid Other | Admitting: Physical Therapy

## 2017-05-28 ENCOUNTER — Ambulatory Visit (INDEPENDENT_AMBULATORY_CARE_PROVIDER_SITE_OTHER): Payer: Medicaid Other | Admitting: Family Medicine

## 2017-05-28 ENCOUNTER — Encounter: Payer: Self-pay | Admitting: Family Medicine

## 2017-05-28 ENCOUNTER — Ambulatory Visit (HOSPITAL_COMMUNITY)
Admission: RE | Admit: 2017-05-28 | Discharge: 2017-05-28 | Disposition: A | Discharge status: Home / Self Care | Payer: Medicaid Other | Source: Ambulatory Visit | Attending: Family Medicine | Admitting: Family Medicine

## 2017-05-28 VITALS — BP 158/104 | HR 66 | Temp 98.7°F | Resp 16 | Ht 70.0 in | Wt 145.0 lb

## 2017-05-28 DIAGNOSIS — M5430 Sciatica, unspecified side: Secondary | ICD-10-CM | POA: Diagnosis not present

## 2017-05-28 DIAGNOSIS — F17200 Nicotine dependence, unspecified, uncomplicated: Secondary | ICD-10-CM | POA: Diagnosis not present

## 2017-05-28 DIAGNOSIS — Z1159 Encounter for screening for other viral diseases: Secondary | ICD-10-CM | POA: Diagnosis not present

## 2017-05-28 DIAGNOSIS — M25561 Pain in right knee: Secondary | ICD-10-CM | POA: Diagnosis not present

## 2017-05-28 DIAGNOSIS — J4520 Mild intermittent asthma, uncomplicated: Secondary | ICD-10-CM

## 2017-05-28 DIAGNOSIS — Z1211 Encounter for screening for malignant neoplasm of colon: Secondary | ICD-10-CM

## 2017-05-28 DIAGNOSIS — I10 Essential (primary) hypertension: Secondary | ICD-10-CM

## 2017-05-28 DIAGNOSIS — R768 Other specified abnormal immunological findings in serum: Secondary | ICD-10-CM

## 2017-05-28 DIAGNOSIS — M549 Dorsalgia, unspecified: Secondary | ICD-10-CM | POA: Diagnosis not present

## 2017-05-28 DIAGNOSIS — R079 Chest pain, unspecified: Secondary | ICD-10-CM | POA: Insufficient documentation

## 2017-05-28 DIAGNOSIS — R918 Other nonspecific abnormal finding of lung field: Secondary | ICD-10-CM | POA: Insufficient documentation

## 2017-05-28 LAB — POCT URINALYSIS DIP (DEVICE)
Bilirubin Urine: NEGATIVE
GLUCOSE, UA: NEGATIVE mg/dL
Hgb urine dipstick: NEGATIVE
Ketones, ur: NEGATIVE mg/dL
LEUKOCYTES UA: NEGATIVE
NITRITE: NEGATIVE
Protein, ur: NEGATIVE mg/dL
Specific Gravity, Urine: 1.025 (ref 1.005–1.030)
UROBILINOGEN UA: 0.2 mg/dL (ref 0.0–1.0)
pH: 7 (ref 5.0–8.0)

## 2017-05-28 MED ORDER — ALBUTEROL SULFATE HFA 108 (90 BASE) MCG/ACT IN AERS: 2.0000 | INHALATION_SPRAY | RESPIRATORY_TRACT | 2 refills | Status: DC | PRN

## 2017-05-28 MED ORDER — AMLODIPINE BESYLATE 5 MG PO TABS
5.0000 mg | ORAL_TABLET | Freq: Every day | ORAL | 0 refills | Status: DC
Start: 2017-05-28 — End: 2017-06-28

## 2017-05-28 MED ORDER — GABAPENTIN 300 MG PO CAPS: 300.0000 mg | ORAL_CAPSULE | Freq: Every day | ORAL | 3 refills | Status: DC

## 2017-05-28 NOTE — Progress Notes (Signed)
Subjective:    Patient ID: Juan Malone, male    DOB: 12-26-1960, 57 y.o.   MRN: [id number]  HPI  Juan Malone, a 57 year old male that presents to establish care. Patient says that he has not had a primary provider. Patient reports that he sustained a fracture to right patella several months ago. He underwent an open reduction internal fixation of patella on 02/23/2017. He has been in physical therapy since that time. He continues to follow with orthopedic services as scheduled.  Patient reports a history of asthma. He was diagnosed as a child. Patient's symptoms include dyspnea, non-productive cough and wheezing.  The patient has been suffering from these symptoms over the past several weeks.Medications used in the past to treat these symptoms include beta agonist inhalers. Suspected precipitants include animal dander, cold air and pollens. Patient is not awakened from sleep. He has not required an emergency room visit. He is a chronic everyday smoker. Patient smokes 0.5 packs per day.   Past Medical History:  Diagnosis Date  . Asthma   . PONV (postoperative nausea and vomiting)    Social History   Socioeconomic History  . Marital status: Single    Spouse name: Not on file  . Number of children: Not on file  . Years of education: Not on file  . Highest education level: Not on file  Social Needs  . Financial resource strain: Not on file  . Food insecurity - worry: Not on file  . Food insecurity - inability: Not on file  . Transportation needs - medical: Not on file  . Transportation needs - non-medical: Not on file  Occupational History  . Not on file  Tobacco Use  . Smoking status: Current Every Day Smoker    Packs/day: 0.25    Years: 5.00    Pack years: 1.25    Types: Cigarettes  . Smokeless tobacco: Never Used  Substance and Sexual Activity  . Alcohol use: Yes    Comment: occassionally weekends   . Drug use: Yes    Types: Marijuana    Comment: weekends  .  Sexual activity: Not on file  Other Topics Concern  . Not on file  Social History Narrative   ** Merged History Encounter **       Review of Systems  HENT: Negative.   Respiratory: Positive for cough and shortness of breath. Negative for choking and chest tightness.   Cardiovascular: Negative.  Negative for chest pain, palpitations and leg swelling.  Gastrointestinal: Negative.   Endocrine: Negative for polydipsia, polyphagia and polyuria.  Genitourinary: Negative.   Musculoskeletal: Positive for arthralgias and back pain.  Hematological: Negative.   Psychiatric/Behavioral: Negative.        Objective:   Physical Exam  Constitutional: He is oriented to person, place, and time.  HENT:  Head: Normocephalic.  Right Ear: External ear normal.  Left Ear: External ear normal.  Mouth/Throat: Oropharynx is clear and moist.  Eyes: Conjunctivae and EOM are normal. Pupils are equal, round, and reactive to light.  Neck: Normal range of motion. Neck supple.  Cardiovascular: Normal rate, regular rhythm and normal heart sounds.  Pulmonary/Chest: Effort normal and breath sounds normal.  Abdominal: Soft. Bowel sounds are normal.  Musculoskeletal: Normal range of motion.  Right leg brace  Neurological: He is alert and oriented to person, place, and time.  Skin: Skin is warm and dry.  Psychiatric: He has a normal mood and affect. His behavior is normal. Judgment and thought  content normal.         BP (!) 158/104 (BP Location: Left Arm, Patient Position: Sitting, Cuff Size: Normal) Comment: manually  Pulse 66   Temp 98.7 F (37.1 C) (Oral)   Resp 16   Ht 5\' 10"  (1.778 m)   Wt 145 lb (65.8 kg)   SpO2 98%   BMI 20.81 kg/m   Assessment & Plan:  1. Mild intermittent asthma without complication - albuterol (PROVENTIL HFA;VENTOLIN HFA) 108 (90 Base) MCG/ACT inhaler; Inhale 2 puffs into the lungs every 4 (four) hours as needed for wheezing or shortness of breath.  Dispense: 1 Inhaler;  Refill: 2  2. Right knee pain, unspecified chronicity Patient s/p ORIF. Continue to follow up with orthopedic services as scheduled.   3. Tobacco dependence Smoking cessation instruction/counseling given:  counseled patient on the dangers of tobacco use, advised patient to stop smoking, and reviewed strategies to maximize success  4. Essential hypertension Reviewed previous blood pressures, have been consistently above goal.  Will start a trial of amlodipine 5 mg.  Patient to return in 1 week for bp check  - POCT urinalysis dip (device) - amLODipine (NORVASC) 5 MG tablet; Take 1 tablet (5 mg total) by mouth daily.  Dispense: 30 tablet; Refill: 0 - CBC with Differential - Comprehensive metabolic panel - Lipid Panel  5. Back pain with sciatica - gabapentin (NEURONTIN) 300 MG capsule; Take 1 capsule (300 mg total) by mouth at bedtime.  Dispense: 30 capsule; Refill: 3  6. Chest pain, unspecified type - DG Chest 2 View; Future  7. Need for hepatitis C screening test - Hepatitis C Antibody  8. Colon cancer screening - Ambulatory referral to Gastroenterology  9. Positive hepatitis C antibody test - Hepatitis c vrs RNA detect by PCR-qual   RTC: 1 week for bp check and 1 month for hypertension   Nolon Nations  MSN, FNP-C Patient Care [facility] [address] [place], [place] [address] [phone]

## 2017-05-28 NOTE — Patient Instructions (Addendum)
We will follow-up by phone after reviewing chest x-ray.  Sent an albuterol 2 puffs every 4 hours for persistent coughing, shortness of breath, and/or  Wheezing  Your blood pressure is above goal without antihypertensive medications.  We will start a trial of amlodipine 5 mg daily.  Discussed side effects at length.  Also recommend a low-sodium, low-fat diet throughout the day.  Increase water intake to 3-4 bottles a day.- Continue medication, monitor blood pressure at home. Continue DASH diet. Reminder to go to the ER if any CP, SOB, nausea, dizziness, severe HA, changes vision/speech, left arm numbness and tingling and jaw pain.     For chronic back pain with sciatica we will start a trial of gabapentin 300 mg at night.  Refrain from drinking alcohol, driving, or operating machinery while taking this medication.  Recommend smoking cessation   We will follow-up by phone with any abnormal laboratory results

## 2017-05-29 ENCOUNTER — Other Ambulatory Visit: Payer: Self-pay | Admitting: Family Medicine

## 2017-05-29 DIAGNOSIS — R768 Other specified abnormal immunological findings in serum: Secondary | ICD-10-CM

## 2017-05-29 LAB — CBC WITH DIFFERENTIAL/PLATELET
BASOS: 1 %
Basophils Absolute: 0 10*3/uL (ref 0.0–0.2)
EOS (ABSOLUTE): 0.3 10*3/uL (ref 0.0–0.4)
EOS: 5 %
HEMATOCRIT: 38.3 % (ref 37.5–51.0)
Hemoglobin: 12.3 g/dL — ABNORMAL LOW (ref 13.0–17.7)
IMMATURE GRANULOCYTES: 0 %
Immature Grans (Abs): 0 10*3/uL (ref 0.0–0.1)
LYMPHS ABS: 1.6 10*3/uL (ref 0.7–3.1)
Lymphs: 32 %
MCH: 24.1 pg — AB (ref 26.6–33.0)
MCHC: 32.1 g/dL (ref 31.5–35.7)
MCV: 75 fL — AB (ref 79–97)
MONOS ABS: 0.6 10*3/uL (ref 0.1–0.9)
Monocytes: 11 %
NEUTROS PCT: 51 %
Neutrophils Absolute: 2.6 10*3/uL (ref 1.4–7.0)
PLATELETS: 214 10*3/uL (ref 150–379)
RBC: 5.1 x10E6/uL (ref 4.14–5.80)
RDW: 14.9 % (ref 12.3–15.4)
WBC: 5.1 10*3/uL (ref 3.4–10.8)

## 2017-05-29 LAB — COMPREHENSIVE METABOLIC PANEL
A/G RATIO: 1 — AB (ref 1.2–2.2)
ALK PHOS: 113 IU/L (ref 39–117)
ALT: 108 IU/L — AB (ref 0–44)
AST: 94 IU/L — AB (ref 0–40)
Albumin: 4.2 g/dL (ref 3.5–5.5)
BILIRUBIN TOTAL: 0.4 mg/dL (ref 0.0–1.2)
BUN/Creatinine Ratio: 10 (ref 9–20)
BUN: 8 mg/dL (ref 6–24)
CALCIUM: 9.4 mg/dL (ref 8.7–10.2)
CHLORIDE: 99 mmol/L (ref 96–106)
CO2: 26 mmol/L (ref 20–29)
Creatinine, Ser: 0.81 mg/dL (ref 0.76–1.27)
GFR calc Af Amer: 116 mL/min/{1.73_m2} (ref >59)
GFR, EST NON AFRICAN AMERICAN: 101 mL/min/{1.73_m2} (ref >59)
GLOBULIN, TOTAL: 4.4 g/dL (ref 1.5–4.5)
Glucose: 76 mg/dL (ref 65–99)
POTASSIUM: 4.1 mmol/L (ref 3.5–5.2)
Sodium: 138 mmol/L (ref 134–144)
Total Protein: 8.6 g/dL — ABNORMAL HIGH (ref 6.0–8.5)

## 2017-05-29 LAB — LIPID PANEL
Chol/HDL Ratio: 2.4 ratio (ref 0.0–5.0)
Cholesterol, Total: 168 mg/dL (ref 100–199)
HDL: 70 mg/dL (ref >39)
LDL Calculated: 87 mg/dL (ref 0–99)
Triglycerides: 54 mg/dL (ref 0–149)
VLDL Cholesterol Cal: 11 mg/dL (ref 5–40)

## 2017-05-29 LAB — HEPATITIS C ANTIBODY: Hep C Virus Ab: >11 s/co ratio — ABNORMAL HIGH (ref 0.0–0.9)

## 2017-05-31 ENCOUNTER — Telehealth: Payer: Self-pay

## 2017-05-31 NOTE — Telephone Encounter (Signed)
-----   Message from Massie MaroonLachina M [place], [place] sent at 05/29/2017  7:54 AM EST ----- Regarding: Lab results and imaging results Please inform patient that chest x-ray shows hyperexpansion of the lungs, which is consistent with mild intermittent asthma.  Recommend albuterol 2 puffs every 4 hours as needed for wheezing, shortness of breath, or persistent coughing.  Also amlodipine for hypertension as discussed during appointment scheduled to return for blood pressure check.   Thanks

## 2017-05-31 NOTE — Telephone Encounter (Signed)
Called and spoke with patient, advised that xray shows hyperexpansion of the lungs which is consistent with mild intermittent asthma. Recommended that he use the albuterol inhaler 2 puffs every 4 hours as needed for wheezing, shortness of breath, or persistent coughing. Patient verbalized understanding.   Advised that he should take his amlodipine for hypertension which was given at his appointment on 05/28/2017 and keep blood pressure check on Friday 06/04/2017.   Also advised that when he comes in for bp check we will need to collect some more blood work for added test. Thanks!

## 2017-06-02 ENCOUNTER — Encounter: Payer: Self-pay | Admitting: Physical Therapy

## 2017-06-02 ENCOUNTER — Ambulatory Visit: Payer: Medicaid Other | Attending: Orthopaedic Surgery | Admitting: Physical Therapy

## 2017-06-02 DIAGNOSIS — M25561 Pain in right knee: Secondary | ICD-10-CM | POA: Insufficient documentation

## 2017-06-02 DIAGNOSIS — G8929 Other chronic pain: Secondary | ICD-10-CM | POA: Insufficient documentation

## 2017-06-02 DIAGNOSIS — J4520 Mild intermittent asthma, uncomplicated: Secondary | ICD-10-CM | POA: Insufficient documentation

## 2017-06-02 DIAGNOSIS — I10 Essential (primary) hypertension: Secondary | ICD-10-CM | POA: Insufficient documentation

## 2017-06-02 DIAGNOSIS — R2689 Other abnormalities of gait and mobility: Secondary | ICD-10-CM | POA: Diagnosis present

## 2017-06-02 DIAGNOSIS — M25661 Stiffness of right knee, not elsewhere classified: Secondary | ICD-10-CM | POA: Insufficient documentation

## 2017-06-02 DIAGNOSIS — M6281 Muscle weakness (generalized): Secondary | ICD-10-CM | POA: Insufficient documentation

## 2017-06-02 DIAGNOSIS — R600 Localized edema: Secondary | ICD-10-CM | POA: Diagnosis present

## 2017-06-02 HISTORY — DX: Essential (primary) hypertension: I10

## 2017-06-02 NOTE — Therapy (Signed)
[place], [place], [address] Phone: [phone]   Fax:  [phone]  Physical Therapy Treatment  Patient Details  Name: Juan Malone MRN: [id number] Date of Birth: May 24, 1960 Referring Provider: Ophelia Charter MD   Encounter Date: 06/02/2017  PT End of Session - 06/02/17 1108    Visit Number  17    Number of Visits  21    Date for PT Re-Evaluation  06/21/17    Authorization Type  Medicaid     Authorization - Visit Number  1    Authorization - Number of Visits  3    PT Start Time  6962    PT Stop Time  9528    PT Time Calculation (min)  45 min       Past Medical History:  Diagnosis Date  . Asthma   . PONV (postoperative nausea and vomiting)     Past Surgical History:  Procedure Laterality Date  . HAND SURGERY    . ORIF PATELLA Right 02/23/2017   Procedure: OPEN REDUCTION INTERNAL (ORIF) FIXATION PATELLA;  Surgeon: Hiram Gash, MD;  Location: [facility];  Service: Orthopedics;  Laterality: Right;  . SHOULDER SURGERY      There were no vitals filed for this visit.  Subjective Assessment - 06/02/17 1107    Subjective  Doing the exercises. Pain is not constant anymore.     Currently in Pain?  Yes    Pain Score  5     Pain Location  Knee    Pain Descriptors / Indicators  Aching;Sharp    Pain Frequency  Intermittent    Aggravating Factors   prolonged, standing     Pain Relieving Factors  ice, asprin         [facility] PT Assessment - 06/02/17 0001      ROM / Strength   AROM / PROM / Strength  Strength      AROM   Right Knee Flexion  108      Strength   Right/Left Knee  Right    Left Knee Flexion  4/5    Left Knee Extension  4/5 pain with testing                  [facility] Adult PT Treatment/Exercise - 06/02/17 0001      Knee/Hip Exercises: Stretches   Hip Flexor Stretch  3 reps;20 seconds off edge of mat, strap pull    Gastroc Stretch  -- slant board x 60  sec       Knee/Hip Exercises: Aerobic   Recumbent Bike  pt requiring warm up before full revolution could be performed, Then level 1 x 2 minutes       Knee/Hip Exercises: Standing   Heel Raises  Both;20 reps    Lateral Step Up  Right;10 reps;Hand Hold: 1;Step Height: 8"    Forward Step Up  Right;2 sets;10 reps;Hand Hold: 2;Step Height: 8"    Step Down  Step Height: 2";Hand Hold: 2;10 reps;2 sets;Right    Functional Squat  15 reps    Other Standing Knee Exercises  SLS on foam mat 20 sec right    Other Standing Knee Exercises  2 way hip on foam pad with light UE touch as needed      Knee/Hip Exercises: Supine   Short Arc Quad Sets  2 sets;10 reps 3#    Straight Leg Raises  15 reps  PT Short Term Goals - 05/10/17 1113      PT SHORT TERM GOAL #1   Title  pt to be I with initial HEP     Time  4    Period  Weeks    Status  Achieved      PT SHORT TERM GOAL #2   Title  pt to increase knee flexion to >/= 90 degrees to promote functional therapuetic progression    Time  4    Period  Weeks    Status  Achieved      PT SHORT TERM GOAL #3   Title  pt reduce R knee edema by >/= 1cm to promote knee mobility and reduce pain     Time  4    Period  Weeks    Status  Achieved        PT Long Term Goals - 06/02/17 1144      PT LONG TERM GOAL #1   Title  increaes knee mobility to >/= -2 to 120 degrees to promote functional and efficent gait pattern     Baseline  108    Time  4    Period  Weeks    Status  On-going      PT LONG TERM GOAL #2   Title  pt to increase R LE strength to >/= 4/5 to promote hip/ knee stability for walking/ standing ADLs    Time  4    Period  Weeks    Status  Achieved      PT LONG TERM GOAL #3   Title  pt to be able to sit/ stand and walk for >/= 45 min with <=2/10 pain and no AD for functional endurnace required for ADLs     Baseline  Sit  tolerated 15 minutes (Then hip right goes numb),  stand 10 to 15 minutes,  walking 10 to 15  minutes with pain 8-10/10    Time  8    Period  Weeks    Status  Unable to assess      PT LONG TERM GOAL #4   Title  increase FOTO score to </= 45% limited to demo improvement in function    Baseline  67% limitation    Time  8    Period  Weeks    Status  Unable to assess            Plan - 06/02/17 1142    Clinical Impression Statement  Pt arrives after 2 weeks absence while obataining insurance authorization. He demonstrates 4/5 knee strength and improved knee flexion AROM  to 108 degrees. LTG# 2 met.     PT Next Visit Plan  FOTO update soon ;. continue/progress with CKC strengthening, SLS exercises, reviewed hip flexor stretch, 120 deg by 12 weeks (14weeks on 3/11), gait training,     PT Home Exercise Plan  standing weight shifting, quad set, glute set and heel slides, prone hamstring curls, sidelying hip abduction. SLR    Consulted and Agree with Plan of Care  Patient       Patient will benefit from skilled therapeutic intervention in order to improve the following deficits and impairments:  Pain, Abnormal gait, Decreased endurance, Decreased activity tolerance, Decreased balance, Decreased range of motion, Impaired flexibility, Difficulty walking, Decreased strength, Decreased scar mobility, Improper body mechanics, Increased edema  Visit Diagnosis: Chronic pain of right knee  Stiffness of right knee, not elsewhere classified  Localized edema  Other abnormalities of gait and  mobility  Muscle weakness (generalized)     Problem List Patient Active Problem List   Diagnosis Date Noted  . Fracture of patella with routine healing, right, closed 02/19/2017    Dorene Ar, PTA 06/02/2017, 11:47 AM  [place], [place], [address] Phone: [phone]   Fax:  [phone]  Name: WHYATT KLINGER MRN: [id number] Date of Birth: 1960-08-08

## 2017-06-08 ENCOUNTER — Ambulatory Visit (INDEPENDENT_AMBULATORY_CARE_PROVIDER_SITE_OTHER): Payer: Medicaid Other

## 2017-06-08 VITALS — BP 140/88

## 2017-06-08 DIAGNOSIS — Z0130 Encounter for examination of blood pressure without abnormal findings: Secondary | ICD-10-CM | POA: Diagnosis not present

## 2017-06-08 NOTE — Progress Notes (Signed)
Patient came in today for bp check his bp was 140/88 manually. He has not taken is medication since 4pm yesterday. I advised that this is in limits and he needs to continue to take medication every day as prescribed and keep next scheduled appointment. Thanks!

## 2017-06-09 ENCOUNTER — Ambulatory Visit: Payer: Medicaid Other | Admitting: Physical Therapy

## 2017-06-09 ENCOUNTER — Encounter: Payer: Self-pay | Admitting: Physical Therapy

## 2017-06-09 DIAGNOSIS — R2689 Other abnormalities of gait and mobility: Secondary | ICD-10-CM

## 2017-06-09 DIAGNOSIS — M6281 Muscle weakness (generalized): Secondary | ICD-10-CM

## 2017-06-09 DIAGNOSIS — G8929 Other chronic pain: Secondary | ICD-10-CM

## 2017-06-09 DIAGNOSIS — M25561 Pain in right knee: Secondary | ICD-10-CM | POA: Diagnosis not present

## 2017-06-09 DIAGNOSIS — M25661 Stiffness of right knee, not elsewhere classified: Secondary | ICD-10-CM

## 2017-06-09 DIAGNOSIS — R600 Localized edema: Secondary | ICD-10-CM

## 2017-06-09 NOTE — Therapy (Signed)
[facility] [address] [place], [place], [address] Phone: [phone]   Fax:  [phone]  Physical Therapy Treatment / Progress note  Patient Details  Name: Juan Malone MRN: [id number] Date of Birth: 1960-11-10 Referring Provider: Ramond Marrow MD   Encounter Date: 06/09/2017  PT End of Session - 06/09/17 1239    Visit Number  18    Number of Visits  21    Date for PT Re-Evaluation  06/21/17    PT Start Time  1101    PT Stop Time  1146    PT Time Calculation (min)  45 min    Activity Tolerance  Patient tolerated treatment well    Behavior During Therapy  [facility] for tasks assessed/performed       Past Medical History:  Diagnosis Date  . Asthma   . PONV (postoperative nausea and vomiting)     Past Surgical History:  Procedure Laterality Date  . HAND SURGERY    . ORIF PATELLA Right 02/23/2017   Procedure: OPEN REDUCTION INTERNAL (ORIF) FIXATION PATELLA;  Surgeon: Bjorn Pippin, MD;  Location: [facility];  Service: Orthopedics;  Laterality: Right;  . SHOULDER SURGERY      There were no vitals filed for this visit.  Subjective Assessment - 06/09/17 1101    Subjective  "I am alittle more sore today, My girlfriend had back surgery so I wasn't able to do as much exercises"    Currently in Pain?  Yes    Pain Score  7     Pain Orientation  Left;Anterior    Pain Descriptors / Indicators  Aching;Sore    Pain Type  Chronic pain    Pain Onset  More than a month ago    Aggravating Factors   prlonged standing/ walking    Pain Relieving Factors  ice, asprin         [facility] PT Assessment - 06/09/17 1115      AROM   Right Knee Extension  2    Right Knee Flexion  105      Strength   Left Knee Flexion  4/5    Left Knee Extension  4/5 pain during testing                  [facility] Adult PT Treatment/Exercise - 06/09/17 1126      Knee/Hip Exercises: Aerobic   Recumbent Bike  L1 x 5 min    Nustep   L5 x 3 min      Knee/Hip Exercises: Standing   Heel Raises  2 sets;20 reps in //    Forward Step Up  Right;2 sets;10 reps;Hand Hold: 2;Step Height: 8" in //    Step Down  Step Height: 2";Hand Hold: 2;10 reps;2 sets;Right    Functional Squat  15 reps in //      Knee/Hip Exercises: Supine   Short Arc Quad Sets  2 sets;15 reps with ball squeeze, 2#    Straight Leg Raises  15 reps;Strengthening;Right;2 sets      Knee/Hip Exercises: Sidelying   Hip ABduction  2 sets;Right;Strengthening;15 reps 2#      Manual Therapy   Joint Mobilization  patellar mobs grade 3 in all planes, Tibiofemoral mobs grade 3-4 AP with pt actively bending knee between rest breaks.           Balance Exercises - 06/09/17 1135      Balance Exercises: Standing   SLS  Eyes open;Solid surface;Foam/compliant surface;4  reps 2 on floor, 2  on airex pad        PT Education - 06/09/17 1238    Education provided  Yes    Education Details  proper position of the foot and knee with ascending and descending steps to promote proper alignment    Person(s) Educated  Patient    Methods  Explanation;Verbal cues;Demonstration    Comprehension  Verbalized understanding;Verbal cues required;Returned demonstration       PT Short Term Goals - 05/10/17 1113      PT SHORT TERM GOAL #1   Title  pt to be I with initial HEP     Time  4    Period  Weeks    Status  Achieved      PT SHORT TERM GOAL #2   Title  pt to increase knee flexion to >/= 90 degrees to promote functional therapuetic progression    Time  4    Period  Weeks    Status  Achieved      PT SHORT TERM GOAL #3   Title  pt reduce R knee edema by >/= 1cm to promote knee mobility and reduce pain     Time  4    Period  Weeks    Status  Achieved        PT Long Term Goals - 06/02/17 1144      PT LONG TERM GOAL #1   Title  increaes knee mobility to >/= -2 to 120 degrees to promote functional and efficent gait pattern     Baseline  108    Time  4    Period   Weeks    Status  On-going      PT LONG TERM GOAL #2   Title  pt to increase R LE strength to >/= 4/5 to promote hip/ knee stability for walking/ standing ADLs    Time  4    Period  Weeks    Status  Achieved      PT LONG TERM GOAL #3   Title  pt to be able to sit/ stand and walk for >/= 45 min with <=2/10 pain and no AD for functional endurnace required for ADLs     Baseline  Sit  tolerated 15 minutes (Then hip right goes numb),  stand 10 to 15 minutes,  walking 10 to 15 minutes with pain 8-10/10    Time  8    Period  Weeks    Status  Unable to assess      PT LONG TERM GOAL #4   Title  increase FOTO score to </= 45% limited to demo improvement in function    Baseline  67% limitation    Time  8    Period  Weeks    Status  Unable to assess            Plan - 06/09/17 1241    Clinical Impression Statement  pt reports 7/10 pain today. He is progressing with ROM and strength but continues to report pain with knee MMT at 4/5. continued manual techniques to improve patellofemoral and tibiofemoral ROM and strengthening of bil LE. pt requires verbal cues for proper foot/ knee alignment with steps which he reported reduced pain and popping/ clicking and noted 3/10 pain end of session. He would benefit from physical therapyt to conintue with strength, mobility and maximize function.     PT Next Visit Plan  FOTO update soon ;. continue/progress with CKC strengthening, SLS  exercises, reviewed hip flexor stretch, gait training, stair training.     PT Home Exercise Plan  standing weight shifting, quad set, glute set and heel slides, prone hamstring curls, sidelying hip abduction. SLR    Consulted and Agree with Plan of Care  Patient       Patient will benefit from skilled therapeutic intervention in order to improve the following deficits and impairments:  Pain, Abnormal gait, Decreased endurance, Decreased activity tolerance, Decreased balance, Decreased range of motion, Impaired flexibility,  Difficulty walking, Decreased strength, Decreased scar mobility, Improper body mechanics, Increased edema  Visit Diagnosis: Chronic pain of right knee  Stiffness of right knee, not elsewhere classified  Localized edema  Other abnormalities of gait and mobility  Muscle weakness (generalized)     Problem List Patient Active Problem List   Diagnosis Date Noted  . Essential hypertension 06/02/2017  . Mild intermittent asthma without complication 06/02/2017  . Fracture of patella with routine healing, right, closed 02/19/2017   Lulu RidingKristoffer Leamon PT, DPT, LAT, ATC  06/09/17  12:47 PM      [facility] [address] [place], [place], [address] Phone: [phone]   Fax:  [phone]  Name: Juan Malone MRN: [id number] Date of Birth: April 03, 1960

## 2017-06-10 ENCOUNTER — Telehealth: Payer: Self-pay | Admitting: Family Medicine

## 2017-06-10 ENCOUNTER — Other Ambulatory Visit: Payer: Self-pay | Admitting: Family Medicine

## 2017-06-10 DIAGNOSIS — B1920 Unspecified viral hepatitis C without hepatic coma: Secondary | ICD-10-CM

## 2017-06-10 HISTORY — DX: Unspecified viral hepatitis C without hepatic coma: B19.20

## 2017-06-10 LAB — HEPATITIS C VRS RNA DETECT BY PCR-QUAL: HCV RNA NAA Qualitative: POSITIVE — AB

## 2017-06-10 NOTE — Telephone Encounter (Signed)
Juan Malone, a 57 year old male with a history of hypertension established care on 05/28/2017. Patient was confirmed positive for hepatitis C. Discussed diagnosis at length. Patient advised of the following:  Patients informed of the advantages of antivirals, will be referred to infectious disease. . Advised to avoid alcohol because it can accelerate cirrhosis and end-stage liver disease. Contact clinic prior to taking any new prescription pills, over-the counter drugs (such as non-aspirin pain relievers), or supplements, as these can potentially damage the liver.  Will discuss vaccinations during follow up appointment on 06/16/2017.,    Juan NationsLachina Moore [name]  MSN, FNP-C [facility] [address]  [place], [place] [address] [phone]

## 2017-06-16 ENCOUNTER — Ambulatory Visit (INDEPENDENT_AMBULATORY_CARE_PROVIDER_SITE_OTHER): Payer: Medicaid Other | Admitting: Family Medicine

## 2017-06-16 VITALS — BP 134/86

## 2017-06-16 DIAGNOSIS — Z0130 Encounter for examination of blood pressure without abnormal findings: Secondary | ICD-10-CM | POA: Diagnosis not present

## 2017-06-16 NOTE — Progress Notes (Signed)
Patient is here today for a bp check only. His manually bp was 134/86. He states he has taken his amlodipine this morning and is doing well. I advised him to continue taking this medication every day consistently and to follow up in 1 month for htn.

## 2017-06-17 ENCOUNTER — Encounter: Payer: Self-pay | Admitting: Physical Therapy

## 2017-06-17 ENCOUNTER — Ambulatory Visit: Payer: Medicaid Other | Admitting: Physical Therapy

## 2017-06-17 DIAGNOSIS — R2689 Other abnormalities of gait and mobility: Secondary | ICD-10-CM

## 2017-06-17 DIAGNOSIS — G8929 Other chronic pain: Secondary | ICD-10-CM

## 2017-06-17 DIAGNOSIS — M6281 Muscle weakness (generalized): Secondary | ICD-10-CM

## 2017-06-17 DIAGNOSIS — R600 Localized edema: Secondary | ICD-10-CM

## 2017-06-17 DIAGNOSIS — M25561 Pain in right knee: Principal | ICD-10-CM

## 2017-06-17 DIAGNOSIS — M25661 Stiffness of right knee, not elsewhere classified: Secondary | ICD-10-CM

## 2017-06-17 NOTE — Patient Instructions (Signed)
Ball squeeze while peforming LAQ 2 x 10  And SAQ for VMO activation.

## 2017-06-17 NOTE — Therapy (Signed)
[place], [place], [address] Phone: [phone]   Fax:  [phone]  Physical Therapy Treatment/ Re-evaluation  Patient Details  Name: Juan Malone MRN: [id number] Date of Birth: January 04, 1961 Referring Provider: Ophelia Charter MD   Encounter Date: 06/17/2017  PT End of Session - 06/17/17 1107    Visit Number  19    Number of Visits  25    Date for PT Re-Evaluation  07/22/17    Authorization Type  Medicaid     Authorization - Visit Number  3    Authorization - Number of Visits  3    PT Start Time  1055    PT Stop Time  1140    PT Time Calculation (min)  45 min    Activity Tolerance  Patient tolerated treatment well    Behavior During Therapy  [facility] for tasks assessed/performed       Past Medical History:  Diagnosis Date  . Asthma   . PONV (postoperative nausea and vomiting)     Past Surgical History:  Procedure Laterality Date  . HAND SURGERY    . ORIF PATELLA Right 02/23/2017   Procedure: OPEN REDUCTION INTERNAL (ORIF) FIXATION PATELLA;  Surgeon: Hiram Gash, MD;  Location: [facility];  Service: Orthopedics;  Laterality: Right;  . SHOULDER SURGERY      There were no vitals filed for this visit.  Subjective Assessment - 06/17/17 1104    Subjective  MD took me of the brace. I am wearing a brace that was meant for my girlfriend but she does not need it. It is much more comfortable. F/U [address].     Currently in Pain?  Yes    Pain Score  0-No pain up to 7/10 with aggravating factors     Pain Location  Knee    Pain Orientation  Right;Anterior    Pain Frequency  Intermittent    Aggravating Factors   step ups, prolonged walking and standing.     Pain Relieving Factors  ice, asprin          [facility] PT Assessment - 06/17/17 0001      Observation/Other Assessments   Focus on Therapeutic Outcomes (FOTO)   65 % limitation      AROM   Right Knee Flexion  110      Strength   Left Knee Flexion  4+/5 pain during testing     Left Knee Extension  4+/5 pain during testing            No data recorded       [facility] Adult PT Treatment/Exercise - 06/17/17 0001      Knee/Hip Exercises: Standing   Lateral Step Up  Right;10 reps;Hand Hold: 1;Step Height: 6"    Forward Step Up  2 sets;10 reps;Hand Hold: 1;Step Height: 6"      Knee/Hip Exercises: Seated   Long Arc Quad  20 reps with ball squeeze       Knee/Hip Exercises: Supine   Short Arc Quad Sets  2 sets;15 reps    Straight Leg Raises  15 reps;Strengthening;Right;2 sets      Knee/Hip Exercises: Prone   Hamstring Curl  20 reps 3#      Manual Therapy   Manual Therapy  Taping    McConnell  lateral to medial pull for patella tracking.                PT Short Term Goals -  05/10/17 1113      PT SHORT TERM GOAL #1   Title  pt to be I with initial HEP     Time  4    Period  Weeks    Status  Achieved      PT SHORT TERM GOAL #2   Title  pt to increase knee flexion to >/= 90 degrees to promote functional therapuetic progression    Time  4    Period  Weeks    Status  Achieved      PT SHORT TERM GOAL #3   Title  pt reduce R knee edema by >/= 1cm to promote knee mobility and reduce pain     Time  4    Period  Weeks    Status  Achieved        PT Long Term Goals - 06/17/17 1109      PT LONG TERM GOAL #1   Title  increaes knee mobility to >/= -2 to 120 degrees to promote functional and efficent gait pattern     Baseline  0-110 AROM    Time  4    Period  Weeks    Status  On-going      PT LONG TERM GOAL #2   Title  pt to increase R LE strength to >/= 4/5 to promote hip/ knee stability for walking/ standing ADLs    Baseline  4+/5    Time  4    Period  Weeks    Status  Achieved      PT LONG TERM GOAL #3   Title  pt to be able to sit/ stand and walk for >/= 45 min with <=2/10 pain and no AD for functional endurnace required for ADLs     Baseline  limited to 30 minutes prior to 5/10  pain requiring rest break.     Time  8    Period  Weeks    Status  On-going      PT LONG TERM GOAL #4   Title  increase FOTO score to </= 45% limited to demo improvement in function    Baseline  65% limitation    Time  8    Period  Weeks    Status  On-going            Plan - 06/17/17 1127    Clinical Impression Statement  Pt saw MD yesterday who discontinued his knee brace and encouraged him to continue PT. Pt reports 0/10 pain at rest. He reports 7/10 pain with step ups and prolonged walking/standing. His pain is located proximal and lateral patella during step ups. Used mcconnel tape today to promote proper patella tacking and pt reports pain reduced to 5/10 during step up. He reports 30 minute limitation with standing and walking due to increased knee pain. His Knee ROM has improved to 0-110 actively. His strength has improved to 4+/5 in right knee with pain during testing. LTG# 2 met. He is progressing toward remaining LTGs. He would benefot from continued physical therapy to continue with strength, mobility, and maximize function.     PT Frequency  2x / week    PT Duration  3 weeks    PT Treatment/Interventions  ADLs/Self Care Home Management;Electrical Stimulation;Iontophoresis 36m/ml Dexamethasone;Moist Heat;Ultrasound;Gait training;Stair training;Therapeutic activities;Therapeutic exercise;Manual techniques;Taping;Passive range of motion;Scar mobilization;Balance training;Neuromuscular re-education;Cryotherapy;Vasopneumatic Device    PT Next Visit Plan  continue/progress with CKC strengthening, SLS exercises, reviewed hip flexor stretch, gait training, stair training.  PT Home Exercise Plan  standing weight shifting, quad set, glute set and heel slides, prone hamstring curls, sidelying hip abduction. SLR    Consulted and Agree with Plan of Care  Patient       Patient will benefit from skilled therapeutic intervention in order to improve the following deficits and impairments:   Pain, Abnormal gait, Decreased endurance, Decreased activity tolerance, Decreased balance, Decreased range of motion, Impaired flexibility, Difficulty walking, Decreased strength, Decreased scar mobility, Improper body mechanics, Increased edema  Visit Diagnosis: Chronic pain of right knee  Stiffness of right knee, not elsewhere classified  Localized edema  Other abnormalities of gait and mobility  Muscle weakness (generalized)     Problem List Patient Active Problem List   Diagnosis Date Noted  . Hepatitis C virus infection without hepatic coma 06/10/2017  . Essential hypertension 06/02/2017  . Mild intermittent asthma without complication 15/51/6144  . Fracture of patella with routine healing, right, closed 02/19/2017    [name] PT, DPT, LAT, ATC  06/17/17  12:48 PM      [facility] [address] [place], [place], [address] Phone: [phone]   Fax:  [phone]  Name: Juan Malone MRN: [id number] Date of Birth: [birth year]

## 2017-06-21 ENCOUNTER — Ambulatory Visit (INDEPENDENT_AMBULATORY_CARE_PROVIDER_SITE_OTHER): Payer: Medicaid Other | Admitting: Infectious Diseases

## 2017-06-21 ENCOUNTER — Encounter: Payer: Self-pay | Admitting: Infectious Diseases

## 2017-06-21 VITALS — BP 128/84 | HR 59 | Temp 97.7°F | Ht 70.0 in | Wt 143.0 lb

## 2017-06-21 DIAGNOSIS — B182 Chronic viral hepatitis C: Secondary | ICD-10-CM | POA: Diagnosis present

## 2017-06-21 NOTE — Progress Notes (Signed)
HPI: Juan Malone is a 57 y.o. male who is here for his initial visit for hep C.   No results found for: HCVGENOTYPE, HEPCGENOTYPE  Allergies: Allergies  Allergen Reactions  . Penicillins Anaphylaxis    Has patient had a PCN reaction causing immediate rash, facial/tongue/throat swelling, SOB or lightheadedness with hypotension: yes Has patient had a PCN reaction causing severe rash involving mucus membranes or skin necrosis: no Has patient had a PCN reaction that required hospitalization: yes Has patient had a PCN reaction occurring within the last 10 years: no If all of the above answers are "NO", then may proceed with Cephalosporin use.     Vitals: Temp: 97.7 F (36.5 C) (04/01 1337) Temp Source: Oral (04/01 1337) BP: 128/84 (04/01 1337) Pulse Rate: 59 (04/01 1337)  Past Medical History: Past Medical History:  Diagnosis Date  . Asthma   . PONV (postoperative nausea and vomiting)     Social History: Social History   Socioeconomic History  . Marital status: Single    Spouse name: Not on file  . Number of children: Not on file  . Years of education: Not on file  . Highest education level: Not on file  Occupational History  . Not on file  Social Needs  . Financial resource strain: Not on file  . Food insecurity:    Worry: Not on file    Inability: Not on file  . Transportation needs:    Medical: Not on file    Non-medical: Not on file  Tobacco Use  . Smoking status: Current Every Day Smoker    Packs/day: 0.25    Years: 5.00    Pack years: 1.25    Types: Cigarettes  . Smokeless tobacco: Never Used  Substance and Sexual Activity  . Alcohol use: Yes    Comment: occassionally weekends   . Drug use: Yes    Types: Marijuana    Comment: weekends  . Sexual activity: Not Currently  Lifestyle  . Physical activity:    Days per week: Not on file    Minutes per session: Not on file  . Stress: Not on file  Relationships  . Social connections:    Talks on  phone: Not on file    Gets together: Not on file    Attends religious service: Not on file    Active member of club or organization: Not on file    Attends meetings of clubs or organizations: Not on file    Relationship status: Not on file  Other Topics Concern  . Not on file  Social History Narrative   ** Merged History Encounter **        Labs: No results found for: HIV1RNAQUANT, HIV1RNAVL, CD4TABS, HEPBSAB, HEPBSAG, HCVAB  No results found for: HCVGENOTYPE, HEPCGENOTYPE  No flowsheet data found.  AST (IU/L)  Date Value  05/28/2017 94 (H)   ALT (IU/L)  Date Value  05/28/2017 108 (H)    CrCl: CrCl cannot be calculated (Patient's most recent lab result is older than the maximum 21 days allowed.).  Fibrosis Score: Pending  Child-Pugh Score: Pending  Previous Treatment Regimen: None  Assessment: Juan Malone is here for his visit for the hep C. He is positive with the qualitative RNA test. We are going to get all labs today including genotype and determine treatment once his [place] is back. He has medicaid. Counseled him on the potential therapy with Mavyret since he has medicaid. He promised to be adherent once he is on treatment.  He never did IV drugs but did snort cocaine. This is probably where he got it from.   Recommendations:  All baseline labs Determine treatment once [place] is back  [place], [place].D., BCPS, AAHIVP Clinical Infectious Disease Pharmacist [facility] 06/21/2017, 2:34 PM

## 2017-06-21 NOTE — Assessment & Plan Note (Addendum)
New Patient with Hepatitis C infection, genotype unknown, treatment naive. No recent activity suggesting acute infection.  I discussed with the patient the lab findings that confirm chronic hepatitis C as well as the natural history and progression of disease including about 30% of people who develop cirrhosis of the liver if left untreated and once cirrhosis is established there is a 2-7% risk per year of liver cancer and liver failure.  I discussed the importance of treatment and benefits in reducing the risk, even if significant liver fibrosis exists. I also discussed risk for re-infection following treatment should he not continue to modify risk factors.   His significant other was tested recently and was found to have negative hep c Ab. His AST/ALT were elevated 98, 101 respectively 3 weeks ago with normal bilirubin and platelet count. Will check quantitative RNA, genotype, liver fibrosure, U/S with elastography, PT/INR, hep A/B serologies today.   Plan:   Patient counseled extensively on limiting acetaminophen to no more than 2 grams daily, avoidance of alcohol.  Transmission discussed with patient including sexual transmission, sharing razors and toothbrush.   Will need referral to gastroenterology if concern for cirrhosis pending his liver staging and elastography which will be scheduled today.   He has been clean for 7 years and does not need referral to drug/addiction specialist.   Will prescribe appropriate medication based on genotype and coverage --> he met with pharmacy today to sign Medicaid paperwork for coverage/approval.   Hepatitis A and B titers to be drawn today with appropriate vaccinations as needed   Pneumovax vaccine at upcoming visit --> counseled on this today and would like to wait.   He will return for an appointment to review results and discuss plan for treatment once liver ultrasound/elastography is obtained.

## 2017-06-21 NOTE — Progress Notes (Signed)
[facility]   Name: Juan Malone  DOB: Jan 24, 1961 MRN: [id number]   CC: consideration for treatment for chronic hepatitis C  HPI: Juan Malone is a 57 y.o. male who presents for initial evaluation and management of chronic hepatitis C.  Patient tested positive 06/04/2017 with positive Ab and positive qualitative RNA. Hepatitis C-associated risk factors present are: surgeries/hospitalizations in late 80s/early 90s for fracture repairs, intranasal drug use and smoking crack in the past with sharing of paraphernalia. He has been free from all drugs including frequent alcohol consumption for over 7 years. He denies ever using IV drugs, multiple sexual partners, renal dialysis, tattoos. Patient has not had other studies performed for evaluation and no previous treatment for Hepatitis C. Liver function studies were elevated recently but unknown as to previous liver disease. Patient does not have a family history of liver disease. He does have nose bleeds on occasion but they stop easily. No other signs of liver dysfunction. Hepatitis A/B immunity unknown.   Review of Systems  Constitutional: Negative for chills, fever, malaise/fatigue and weight loss.  Eyes: Negative for blurred vision and double vision.  Respiratory: Negative for cough and shortness of breath.   Cardiovascular: Negative for chest pain, orthopnea and leg swelling.  Gastrointestinal: Negative for abdominal pain, blood in stool and nausea.  Genitourinary: Negative for dysuria.  Musculoskeletal: Positive for joint pain (right knee 2/2 recent surgery ). Negative for back pain and myalgias.  Skin: Negative for rash.  Neurological: Negative for dizziness and headaches.  Psychiatric/Behavioral: Negative for depression.     Past Medical History:  Diagnosis Date  . Asthma   . Essential hypertension 06/02/2017  . Hepatitis C virus infection without hepatic coma 06/10/2017  . PONV (postoperative nausea  and vomiting)    Outpatient Medications Prior to Visit  Medication Sig Dispense Refill  . albuterol (PROVENTIL HFA;VENTOLIN HFA) 108 (90 Base) MCG/ACT inhaler Inhale 2 puffs into the lungs every 4 (four) hours as needed for wheezing or shortness of breath. 1 Inhaler 2  . amLODipine (NORVASC) 5 MG tablet Take 1 tablet (5 mg total) by mouth daily. 30 tablet 0  . gabapentin (NEURONTIN) 300 MG capsule Take 1 capsule (300 mg total) by mouth at bedtime. 30 capsule 3  . meloxicam (MOBIC) 7.5 MG tablet Take 1 tablet (7.5 mg total) by mouth daily. 30 tablet 2   No facility-administered medications prior to visit.     Allergies  Allergen Reactions  . Penicillins Anaphylaxis    Has patient had a PCN reaction causing immediate rash, facial/tongue/throat swelling, SOB or lightheadedness with hypotension: yes Has patient had a PCN reaction causing severe rash involving mucus membranes or skin necrosis: no Has patient had a PCN reaction that required hospitalization: yes Has patient had a PCN reaction occurring within the last 10 years: no If all of the above answers are "NO", then may proceed with Cephalosporin use.     Social History   Tobacco Use  . Smoking status: Current Every Day Smoker    Packs/day: 0.25    Years: 5.00    Pack years: 1.25    Types: Cigarettes  . Smokeless tobacco: Never Used  Substance Use Topics  . Alcohol use: Yes    Comment: occassionally weekends   . Drug use: Yes    Types: Marijuana    Comment: weekends    Family History  Problem Relation Age of Onset  . Diabetes Mother   . Hypertension Father   .  Cancer Father   . Kidney disease Father   . Diabetes Father      Objective:  Constitutional: in no apparent distress, well developed and well nourished, alert, oriented times 3 and normal vitals,  Vitals:   06/21/17 1337  BP: 128/84  Pulse: (!) 59  Temp: 97.7 F (36.5 C)   Eyes: anicteric Cardiovascular: Cor RRR and No murmurs Respiratory:  clear Gastrointestinal: Bowel sounds are normal, liver is not enlarged, spleen is not enlarged Musculoskeletal: peripheral pulses normal, no pedal edema, no clubbing or cyanosis, right knee in brace with ACE wrap  Skin: negative for - jaundice, spider hemangioma, telangiectasia, palmar erythema, ecchymosis and atrophy; no porphyria cutanea tarda Lymphatic: no cervical lymphadenopathy   Laboratory Genotype: No results found for: HCVGENOTYPE HCV viral load: No results found for: HCVQUANT Lab Results  Component Value Date   WBC 5.1 05/28/2017   HGB 12.3 (L) 05/28/2017   HCT 38.3 05/28/2017   MCV 75 (L) 05/28/2017   PLT 214 05/28/2017    Lab Results  Component Value Date   CREATININE 0.81 05/28/2017   BUN 8 05/28/2017   NA 138 05/28/2017   K 4.1 05/28/2017   CL 99 05/28/2017   CO2 26 05/28/2017    Lab Results  Component Value Date   ALT 108 (H) 05/28/2017   AST 94 (H) 05/28/2017   ALKPHOS 113 05/28/2017    Lab Results  Component Value Date   INR 1.1 06/21/2017   BILITOT 0.4 05/28/2017   ALBUMIN 4.2 05/28/2017   Labs and history reviewed and show CHILD-PUGH Class A  5-6 points: Child class A 7-9 points: Child class B 10-15 points: Child class C  Assessment & Plan:   Problem List Items Addressed This Visit      Digestive   Hepatitis C virus infection without hepatic coma - Primary    New Patient with Hepatitis C infection, genotype unknown, treatment naive. No recent activity suggesting acute infection.  I discussed with the patient the lab findings that confirm chronic hepatitis C as well as the natural history and progression of disease including about 30% of people who develop cirrhosis of the liver if left untreated and once cirrhosis is established there is a 2-7% risk per year of liver cancer and liver failure.  I discussed the importance of treatment and benefits in reducing the risk, even if significant liver fibrosis exists. I also discussed risk for  re-infection following treatment should he not continue to modify risk factors.   His significant other was tested recently and was found to have negative hep c Ab. His AST/ALT were elevated 98, 101 respectively 3 weeks ago with normal bilirubin and platelet count. Will check quantitative RNA, genotype, liver fibrosure, U/S with elastography, PT/INR, hep A/B serologies today.   Plan:   Patient counseled extensively on limiting acetaminophen to no more than 2 grams daily, avoidance of alcohol.  Transmission discussed with patient including sexual transmission, sharing razors and toothbrush.   Will need referral to gastroenterology if concern for cirrhosis pending his liver staging and elastography which will be scheduled today.   He has been clean for 7 years and does not need referral to drug/addiction specialist.   Will prescribe appropriate medication based on genotype and coverage --> he met with pharmacy today to sign Medicaid paperwork for coverage/approval.   Hepatitis A and B titers to be drawn today with appropriate vaccinations as needed   Pneumovax vaccine at upcoming visit --> counseled on this today and  would like to wait.   He will return for an appointment to review results and discuss plan for treatment once liver ultrasound/elastography is obtained.        Relevant Orders   Hepatitis C genotype   Hepatitis C RNA quantitative (Completed)   US ABDOMEN COMPLETE W/ELASTOGRAPHY   HIV antibody (Completed)   INR/PT (Completed)   Hepatitis A antibody, total (Completed)   Hepatitis B surface antibody (Completed)   Hepatitis B surface antigen (Completed)   Liver Fibrosis, FibroTest-ActiTest

## 2017-06-22 ENCOUNTER — Ambulatory Visit: Payer: Medicaid Other | Admitting: Physical Therapy

## 2017-06-22 LAB — HEPATITIS B SURFACE ANTIBODY,QUALITATIVE: Hep B S Ab: NONREACTIVE

## 2017-06-22 LAB — HEPATITIS B SURFACE ANTIGEN: HEP B S AG: NONREACTIVE

## 2017-06-22 LAB — HEPATITIS A ANTIBODY, TOTAL: Hepatitis A AB,Total: NONREACTIVE

## 2017-06-22 LAB — PROTIME-INR
INR: 1.1
Prothrombin Time: 11.3 s (ref 9.0–11.5)

## 2017-06-22 LAB — HIV ANTIBODY (ROUTINE TESTING W REFLEX): HIV 1&2 Ab, 4th Generation: NONREACTIVE

## 2017-06-23 ENCOUNTER — Encounter: Payer: Self-pay | Admitting: Infectious Diseases

## 2017-06-24 ENCOUNTER — Ambulatory Visit: Payer: Medicaid Other | Admitting: Physical Therapy

## 2017-06-24 LAB — LIVER FIBROSIS, FIBROTEST-ACTITEST
ALT: 170 U/L — ABNORMAL HIGH (ref 9–46)
Alpha-2-Macroglobulin: 516 mg/dL — ABNORMAL HIGH (ref 106–279)
Apolipoprotein A1: 140 mg/dL (ref 94–176)
Bilirubin: 0.4 mg/dL (ref 0.2–1.2)
Fibrosis Score: 0.8
GGT: 76 U/L (ref 3–95)
HAPTOGLOBIN: 70 mg/dL (ref 43–212)
NECROINFLAMMAT ACT SCORE: 0.87
REFERENCE ID: 2406145

## 2017-06-24 LAB — HEPATITIS C RNA QUANTITATIVE
HCV QUANT LOG: 6.01 {Log_IU}/mL — AB
HCV RNA, PCR, QN: 1020000 IU/mL — ABNORMAL HIGH

## 2017-06-24 LAB — HEPATITIS C GENOTYPE: HCV GENOTYPE: 1

## 2017-06-25 ENCOUNTER — Ambulatory Visit (HOSPITAL_COMMUNITY)
Admission: RE | Admit: 2017-06-25 | Discharge: 2017-06-25 | Disposition: A | Discharge status: Home / Self Care | Payer: Medicaid Other | Source: Ambulatory Visit | Attending: Infectious Diseases | Admitting: Infectious Diseases

## 2017-06-25 DIAGNOSIS — B182 Chronic viral hepatitis C: Secondary | ICD-10-CM | POA: Insufficient documentation

## 2017-06-28 ENCOUNTER — Encounter: Payer: Self-pay | Admitting: Gastroenterology

## 2017-06-28 ENCOUNTER — Telehealth: Payer: Self-pay

## 2017-06-28 DIAGNOSIS — I10 Essential (primary) hypertension: Secondary | ICD-10-CM

## 2017-06-28 MED ORDER — AMLODIPINE BESYLATE 5 MG PO TABS: 5.0000 mg | ORAL_TABLET | Freq: Every day | ORAL | 0 refills | Status: DC

## 2017-06-28 NOTE — Telephone Encounter (Signed)
Refill sent into pharmacy. Thanks!  

## 2017-06-28 NOTE — Progress Notes (Signed)
Subjective:    Patient ID: Juan Malone, male    DOB: 02/13/1961, 57 y.o.   MRN: [id number]  Chief Complaint  Patient presents with  . Hepatitis C     HPI:  Juan Malone is a 57 y.o. male who presents today for routine follow up of Hepatitis C.   He was seen on 4/1 by Janene Madeira, NP for initial evaluation of his Hepatitis C. His blood work shows that he has a genotype 1 with a viral load of 1,020,000. His Fibrosure showed a score of F4 or severe fibrosis with the elastography showing F2 and some F3 with moderate fibrosis. He did not have immunity to Hepatitis B with a negative Hepatiis B surface antibody and decline starting vaccination at most recent office visit. He has met with the pharmacy and signed Medicaid paperwork. He remains asymptomatic. Reports he did consume a "significant" amount of alcohol for his birthday yesterday.    Allergies  Allergen Reactions  . Penicillins Anaphylaxis    Has patient had a PCN reaction causing immediate rash, facial/tongue/throat swelling, SOB or lightheadedness with hypotension: yes Has patient had a PCN reaction causing severe rash involving mucus membranes or skin necrosis: no Has patient had a PCN reaction that required hospitalization: yes Has patient had a PCN reaction occurring within the last 10 years: no If all of the above answers are "NO", then may proceed with Cephalosporin use.       Outpatient Medications Prior to Visit  Medication Sig Dispense Refill  . albuterol (PROVENTIL HFA;VENTOLIN HFA) 108 (90 Base) MCG/ACT inhaler Inhale 2 puffs into the lungs every 4 (four) hours as needed for wheezing or shortness of breath. 1 Inhaler 2  . amLODipine (NORVASC) 5 MG tablet Take 1 tablet (5 mg total) by mouth daily. 30 tablet 0  . gabapentin (NEURONTIN) 300 MG capsule Take 1 capsule (300 mg total) by mouth at bedtime. 30 capsule 3  . meloxicam (MOBIC) 7.5 MG tablet Take 1 tablet (7.5 mg total) by mouth daily. 30 tablet 2     No facility-administered medications prior to visit.      Past Medical History:  Diagnosis Date  . Asthma   . Essential hypertension 06/02/2017  . Hepatitis C virus infection without hepatic coma 06/10/2017  . PONV (postoperative nausea and vomiting)      Review of Systems  Constitutional: Negative for chills, diaphoresis, fatigue and fever.  Respiratory: Negative for cough, chest tightness, shortness of breath and wheezing.   Cardiovascular: Negative for chest pain.  Gastrointestinal: Negative for abdominal distention, abdominal pain, constipation, diarrhea, nausea and vomiting.  Neurological: Negative for weakness and headaches.  Hematological: Does not bruise/bleed easily.      Objective:    BP 139/83   Pulse 72   Temp 98.3 F (36.8 C) (Oral)   Ht 5' 10"  (1.778 m)   Wt 145 lb (65.8 kg)   BMI 20.81 kg/m  Nursing note and vital signs reviewed.  Physical Exam  Constitutional: He is oriented to person, place, and time. He appears well-developed. No distress.  Cardiovascular: Normal rate, regular rhythm, normal heart sounds and intact distal pulses. Exam reveals no gallop and no friction rub.  No murmur heard. Pulmonary/Chest: Effort normal and breath sounds normal. No respiratory distress. He has no wheezes. He has no rales. He exhibits no tenderness.  Abdominal: Soft. Bowel sounds are normal. He exhibits no distension, no ascites and no mass. There is no hepatosplenomegaly. There is no tenderness. There  is no rebound and no guarding.  Neurological: He is alert and oriented to person, place, and time.  Skin: Skin is warm and dry.  Psychiatric: He has a normal mood and affect. His behavior is normal. Judgment and thought content normal.       Assessment & Plan:   Problem List Items Addressed This Visit      Digestive   Hepatitis C virus infection without hepatic coma - Primary    Juan Malone has Genotype 1 Hepatitis C with F2-F3 noted on his elastrography and F4 on  the fibrosure. He remains asymptomatic presently. Discussed treatment options and will start him on Mavyret for 8 weeks. Given increased levels of fibrosis noted on elastography will refer to gastroenterology for evaluation and if needed treatment. First Hepatitis B vaccination today. Will plan to follow up with pharmacy in 1 month after start of medication.       Relevant Medications   Glecaprevir-Pibrentasvir (MAVYRET) 100-40 MG TABS   Other Relevant Orders   Hepatitis A vaccine adult IM (Completed)   Hepatitis B vaccine adult IM (Completed)    Other Visit Diagnoses    Liver fibrosis       Relevant Orders   Hepatitis A vaccine adult IM (Completed)   Hepatitis B vaccine adult IM (Completed)   Need for hepatitis A and B vaccination       Relevant Orders   Hepatitis A vaccine adult IM (Completed)   Hepatitis B vaccine adult IM (Completed)       I am having Juan Malone start on [place]. I am also having him maintain his meloxicam, gabapentin, albuterol, and amLODipine.   Meds ordered this encounter  Medications  . Glecaprevir-Pibrentasvir (MAVYRET) 100-40 MG TABS    Sig: Take 3 tablets by mouth daily.    Dispense:  84 tablet    Refill:  1    Order Specific Question:   Supervising Provider    Answer:   Carlyle Basques [[id number]]     Follow-up: Return in about 1 month (around 08/04/2017) for with pharmacy.   Terri Piedra, MSN, [facility]

## 2017-06-29 ENCOUNTER — Encounter: Payer: Self-pay | Admitting: Physical Therapy

## 2017-06-29 ENCOUNTER — Ambulatory Visit: Payer: Medicaid Other | Attending: Orthopaedic Surgery | Admitting: Physical Therapy

## 2017-06-29 DIAGNOSIS — G8929 Other chronic pain: Secondary | ICD-10-CM | POA: Insufficient documentation

## 2017-06-29 DIAGNOSIS — R600 Localized edema: Secondary | ICD-10-CM | POA: Diagnosis present

## 2017-06-29 DIAGNOSIS — M6281 Muscle weakness (generalized): Secondary | ICD-10-CM | POA: Diagnosis present

## 2017-06-29 DIAGNOSIS — M25561 Pain in right knee: Secondary | ICD-10-CM | POA: Insufficient documentation

## 2017-06-29 DIAGNOSIS — M25661 Stiffness of right knee, not elsewhere classified: Secondary | ICD-10-CM | POA: Insufficient documentation

## 2017-06-29 DIAGNOSIS — R2689 Other abnormalities of gait and mobility: Secondary | ICD-10-CM | POA: Diagnosis present

## 2017-06-29 NOTE — Therapy (Signed)
[facility] [address] [place], [place], [address] Phone: [phone]   Fax:  [phone]  Physical Therapy Treatment  Patient Details  Name: Juan Malone MRN: [id number] Date of Birth: March 11, 1961 Referring Provider: Ramond Marrow MD   Encounter Date: 06/29/2017  PT End of Session - 06/29/17 0759    Visit Number  20    Number of Visits  25    Date for PT Re-Evaluation  07/22/17    Authorization Type  Medicaid 6 visits 4/2-4/22    Authorization - Visit Number  1    Authorization - Number of Visits  6    PT Start Time  0800    PT Stop Time  0841    PT Time Calculation (min)  41 min    Activity Tolerance  Patient tolerated treatment well    Behavior During Therapy  [facility] for tasks assessed/performed       Past Medical History:  Diagnosis Date  . Asthma   . Essential hypertension 06/02/2017  . Hepatitis C virus infection without hepatic coma 06/10/2017  . PONV (postoperative nausea and vomiting)     Past Surgical History:  Procedure Laterality Date  . HAND SURGERY    . ORIF PATELLA Right 02/23/2017   Procedure: OPEN REDUCTION INTERNAL (ORIF) FIXATION PATELLA;  Surgeon: Bjorn Pippin, MD;  Location: [facility];  Service: Orthopedics;  Laterality: Right;  . SHOULDER SURGERY      There were no vitals filed for this visit.  Subjective Assessment - 06/29/17 0800    Subjective  My girlfriend had back surgery so I have been doing a little extra work. lateral patellar pain today- weather. I have not been walking it out as much.     Currently in Pain?  Yes    Pain Score  7     Pain Location  Knee    Pain Orientation  Right    Pain Descriptors / Indicators  Sharp    Aggravating Factors   step up                       [facility] Adult PT Treatment/Exercise - 06/29/17 0001      Knee/Hip Exercises: Stretches   Passive Hamstring Stretch  Right;2 reps;30 seconds    ITB Stretch  Right;30 seconds  sidelying edge of table      Knee/Hip Exercises: Aerobic   Elliptical  3 min L1 ramp 3      Knee/Hip Exercises: Standing   Heel Raises  15 reps 3s holds, cues for core+gluts    SLS  single leg mini squat with LLE assist-mirror for visual cues      Knee/Hip Exercises: Prone   Other Prone Exercises  fire hydrant      Manual Therapy   Soft tissue mobilization  IASTM Rt ITB               PT Short Term Goals - 05/10/17 1113      PT SHORT TERM GOAL #1   Title  pt to be I with initial HEP     Time  4    Period  Weeks    Status  Achieved      PT SHORT TERM GOAL #2   Title  pt to increase knee flexion to >/= 90 degrees to promote functional therapuetic progression    Time  4    Period  Weeks    Status  Achieved  PT SHORT TERM GOAL #3   Title  pt reduce R knee edema by >/= 1cm to promote knee mobility and reduce pain     Time  4    Period  Weeks    Status  Achieved        PT Long Term Goals - 06/17/17 1109      PT LONG TERM GOAL #1   Title  increaes knee mobility to >/= -2 to 120 degrees to promote functional and efficent gait pattern     Baseline  0-110 AROM    Time  4    Period  Weeks    Status  On-going      PT LONG TERM GOAL #2   Title  pt to increase R LE strength to >/= 4/5 to promote hip/ knee stability for walking/ standing ADLs    Baseline  4+/5    Time  4    Period  Weeks    Status  Achieved      PT LONG TERM GOAL #3   Title  pt to be able to sit/ stand and walk for >/= 45 min with <=2/10 pain and no AD for functional endurnace required for ADLs     Baseline  limited to 30 minutes prior to 5/10 pain requiring rest break.     Time  8    Period  Weeks    Status  On-going      PT LONG TERM GOAL #4   Title  increase FOTO score to </= 45% limited to demo improvement in function    Baseline  65% limitation    Time  8    Period  Weeks    Status  On-going            Plan - 06/29/17 1610    Clinical Impression Statement  Notable  tightness in ITB and limited VMO activation. Pain not changed with patellar tracking today. Used mirror for corrected form in CKC mini squats which decreased pain. Cues requried to activate core & gluts to stabilize proximal structures.     PT Treatment/Interventions  ADLs/Self Care Home Management;Electrical Stimulation;Iontophoresis 4mg /ml Dexamethasone;Moist Heat;Ultrasound;Gait training;Stair training;Therapeutic activities;Therapeutic exercise;Manual techniques;Taping;Passive range of motion;Scar mobilization;Balance training;Neuromuscular re-education;Cryotherapy;Vasopneumatic Device    PT Next Visit Plan  continue/progress with CKC strengthening, SLS exercises, reviewed hip flexor stretch, gait training, stair training.     PT Home Exercise Plan  standing weight shifting, quad set, glute set and heel slides, prone hamstring curls, sidelying hip abduction. SLR; supine HSS, ITB stretch, fire hydrant    Consulted and Agree with Plan of Care  Patient       Patient will benefit from skilled therapeutic intervention in order to improve the following deficits and impairments:  Pain, Abnormal gait, Decreased endurance, Decreased activity tolerance, Decreased balance, Decreased range of motion, Impaired flexibility, Difficulty walking, Decreased strength, Decreased scar mobility, Improper body mechanics, Increased edema  Visit Diagnosis: Chronic pain of right knee  Stiffness of right knee, not elsewhere classified  Localized edema     Problem List Patient Active Problem List   Diagnosis Date Noted  . Hepatitis C virus infection without hepatic coma 06/10/2017  . Essential hypertension 06/02/2017  . Mild intermittent asthma without complication 06/02/2017  . Fracture of patella with routine healing, right, closed 02/19/2017    Karys Meckley C. Euclide Granito PT, DPT 06/29/17 9:37 AM   [facility] [address] [place], [place], [address] Phone:  [phone]   Fax:  [phone]  Name: Juan Malone MRN: [id number] Date of Birth: 1961-02-26

## 2017-07-02 ENCOUNTER — Ambulatory Visit: Payer: Medicaid Other | Admitting: Physical Therapy

## 2017-07-02 ENCOUNTER — Encounter: Payer: Self-pay | Admitting: Physical Therapy

## 2017-07-02 DIAGNOSIS — R600 Localized edema: Secondary | ICD-10-CM

## 2017-07-02 DIAGNOSIS — M25661 Stiffness of right knee, not elsewhere classified: Secondary | ICD-10-CM

## 2017-07-02 DIAGNOSIS — G8929 Other chronic pain: Secondary | ICD-10-CM

## 2017-07-02 DIAGNOSIS — M25561 Pain in right knee: Secondary | ICD-10-CM | POA: Diagnosis not present

## 2017-07-02 NOTE — Therapy (Signed)
[facility] [address] [place], [place], [address] Phone: [phone]   Fax:  [phone]  Physical Therapy Treatment  Patient Details  Name: Juan Malone MRN: [id number] Date of Birth: 12-15-1960 Referring Provider: Ramond Marrow MD   Encounter Date: 07/02/2017  PT End of Session - 07/02/17 1052    Visit Number  21    Number of Visits  25    Date for PT Re-Evaluation  07/22/17    Authorization Type  Medicaid 6 visits 4/2-4/22    Authorization - Visit Number  2    Authorization - Number of Visits  6    PT Start Time  1048    PT Stop Time  1134    PT Time Calculation (min)  46 min    Activity Tolerance  Patient tolerated treatment well    Behavior During Therapy  [facility] for tasks assessed/performed       Past Medical History:  Diagnosis Date  . Asthma   . Essential hypertension 06/02/2017  . Hepatitis C virus infection without hepatic coma 06/10/2017  . PONV (postoperative nausea and vomiting)     Past Surgical History:  Procedure Laterality Date  . HAND SURGERY    . ORIF PATELLA Right 02/23/2017   Procedure: OPEN REDUCTION INTERNAL (ORIF) FIXATION PATELLA;  Surgeon: Bjorn Pippin, MD;  Location: [facility];  Service: Orthopedics;  Laterality: Right;  . SHOULDER SURGERY      There were no vitals filed for this visit.  Subjective Assessment - 07/02/17 1049    Subjective  Popping under my patella that began about 2 days ago that is painful, feel it every time I step. No change from what the pain was before. Denies feeling fatigue in quads.     Patient Stated Goals  to be able walk again, return to work     Currently in Pain?  Yes    Pain Score  8     Pain Location  Knee    Pain Orientation  Right                       [facility] Adult PT Treatment/Exercise - 07/02/17 0001      Knee/Hip Exercises: Aerobic   Recumbent Bike  5 min L3      Knee/Hip Exercises: Standing   Other Standing  Knee Exercises  wobble board in parallel bars    Other Standing Knee Exercises  tandem, NBOS weight shift      Knee/Hip Exercises: Supine   Straight Leg Raises  Other (comment) mini SLR with 3s holds      Knee/Hip Exercises: Sidelying   Other Sidelying Knee/Hip Exercises  Rt side plank, knee flexed      Knee/Hip Exercises: Prone   Hip Extension  10 reps 3s holds, quad set+hip ext      Manual Therapy   Manual Therapy  Taping    Kinesiotex  Facilitate Muscle      Kinesiotix   Facilitate Muscle   Rt quads- also educated on self application, provided with another strip to apply in a couple of days               PT Short Term Goals - 05/10/17 1113      PT SHORT TERM GOAL #1   Title  pt to be I with initial HEP     Time  4    Period  Weeks    Status  Achieved      PT SHORT TERM GOAL #2   Title  pt to increase knee flexion to >/= 90 degrees to promote functional therapuetic progression    Time  4    Period  Weeks    Status  Achieved      PT SHORT TERM GOAL #3   Title  pt reduce R knee edema by >/= 1cm to promote knee mobility and reduce pain     Time  4    Period  Weeks    Status  Achieved        PT Long Term Goals - 06/17/17 1109      PT LONG TERM GOAL #1   Title  increaes knee mobility to >/= -2 to 120 degrees to promote functional and efficent gait pattern     Baseline  0-110 AROM    Time  4    Period  Weeks    Status  On-going      PT LONG TERM GOAL #2   Title  pt to increase R LE strength to >/= 4/5 to promote hip/ knee stability for walking/ standing ADLs    Baseline  4+/5    Time  4    Period  Weeks    Status  Achieved      PT LONG TERM GOAL #3   Title  pt to be able to sit/ stand and walk for >/= 45 min with <=2/10 pain and no AD for functional endurnace required for ADLs     Baseline  limited to 30 minutes prior to 5/10 pain requiring rest break.     Time  8    Period  Weeks    Status  On-going      PT LONG TERM GOAL #4   Title  increase  FOTO score to </= 45% limited to demo improvement in function    Baseline  65% limitation    Time  8    Period  Weeks    Status  On-going            Plan - 07/02/17 1132    Clinical Impression Statement  quad activation today with minimal movement through knee joint to decrease pain. Pt reported decreased cavitations with placement of tape today. Cont lateral patellar pain with step ups notable. Still wearing hinged brace for fear of knee buckling.     PT Treatment/Interventions  ADLs/Self Care Home Management;Electrical Stimulation;Iontophoresis 4mg /ml Dexamethasone;Moist Heat;Ultrasound;Gait training;Stair training;Therapeutic activities;Therapeutic exercise;Manual techniques;Taping;Passive range of motion;Scar mobilization;Balance training;Neuromuscular re-education;Cryotherapy;Vasopneumatic Device    PT Next Visit Plan  continue/progress with CKC strengthening, SLS exercises, reviewed hip flexor stretch, gait training, stair training.     PT Home Exercise Plan  standing weight shifting, quad set, glute set and heel slides, prone hamstring curls, sidelying hip abduction. SLR; supine HSS, ITB stretch, fire hydrant; tandem & NBOS a/p weight shift, prone quad set+hip ext, Rt side plank    Consulted and Agree with Plan of Care  Patient       Patient will benefit from skilled therapeutic intervention in order to improve the following deficits and impairments:  Pain, Abnormal gait, Decreased endurance, Decreased activity tolerance, Decreased balance, Decreased range of motion, Impaired flexibility, Difficulty walking, Decreased strength, Decreased scar mobility, Improper body mechanics, Increased edema  Visit Diagnosis: Chronic pain of right knee  Stiffness of right knee, not elsewhere classified  Localized edema     Problem List Patient Active Problem List   Diagnosis Date Noted  .  Hepatitis C virus infection without hepatic coma 06/10/2017  . Essential hypertension 06/02/2017  .  Mild intermittent asthma without complication 06/02/2017  . Fracture of patella with routine healing, right, closed 02/19/2017    Jayceon Troy C. Faithe Ariola PT, DPT 07/02/17 11:38 AM   [facility] [address] [place], [place], [address] Phone: [phone]   Fax:  [phone]  Name: Juan Malone MRN: [id number] Date of Birth: 10/20/1960

## 2017-07-05 ENCOUNTER — Encounter: Payer: Self-pay | Admitting: Family

## 2017-07-05 ENCOUNTER — Ambulatory Visit (INDEPENDENT_AMBULATORY_CARE_PROVIDER_SITE_OTHER): Payer: Medicaid Other | Admitting: Family

## 2017-07-05 VITALS — BP 139/83 | HR 72 | Temp 98.3°F | Ht 70.0 in | Wt 145.0 lb

## 2017-07-05 DIAGNOSIS — B1920 Unspecified viral hepatitis C without hepatic coma: Secondary | ICD-10-CM

## 2017-07-05 DIAGNOSIS — K7400 Hepatic fibrosis, unspecified: Secondary | ICD-10-CM

## 2017-07-05 DIAGNOSIS — Z23 Encounter for immunization: Secondary | ICD-10-CM | POA: Diagnosis not present

## 2017-07-05 MED ORDER — GLECAPREVIR-PIBRENTASVIR 100-40 MG PO TABS: 3.0000 | ORAL_TABLET | Freq: Every day | ORAL | 1 refills | Status: DC

## 2017-07-05 NOTE — Patient Instructions (Addendum)
Nice to meet you.  We will get you started on Mavyret for your Hepatitis C with goal of 8 weeks of therapy.   You received the first Hepatitis B vaccination today - will need the second in 1 month and the 3rd in 6 months.   Please plan to follow up with the pharmacy in 1 month after starting the medication.   We will plan to see you back for retest at the completion of treatment.   We have also sent a referral to gastroenterology to check your liver.

## 2017-07-05 NOTE — Assessment & Plan Note (Signed)
Mr. 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 He remains asymptomatic presently. Discussed treatment options and will start him on Mavyret for 8 weeks. Given increased levels of fibrosis noted on elastography will refer to gastroenterology for evaluation and if needed treatment. First Hepatitis B vaccination today. Will plan to follow up with pharmacy in 1 month after start of medication.

## 2017-07-06 ENCOUNTER — Ambulatory Visit: Payer: Medicaid Other | Admitting: Physical Therapy

## 2017-07-08 ENCOUNTER — Ambulatory Visit: Payer: Medicaid Other | Admitting: Physical Therapy

## 2017-07-08 ENCOUNTER — Encounter: Payer: Self-pay | Admitting: Pharmacy Technician

## 2017-07-08 ENCOUNTER — Telehealth: Payer: Self-pay | Admitting: Pharmacist

## 2017-07-08 ENCOUNTER — Encounter: Payer: Self-pay | Admitting: Physical Therapy

## 2017-07-08 DIAGNOSIS — R2689 Other abnormalities of gait and mobility: Secondary | ICD-10-CM

## 2017-07-08 DIAGNOSIS — M25561 Pain in right knee: Secondary | ICD-10-CM | POA: Diagnosis not present

## 2017-07-08 DIAGNOSIS — R600 Localized edema: Secondary | ICD-10-CM

## 2017-07-08 DIAGNOSIS — M6281 Muscle weakness (generalized): Secondary | ICD-10-CM

## 2017-07-08 DIAGNOSIS — M25661 Stiffness of right knee, not elsewhere classified: Secondary | ICD-10-CM

## 2017-07-08 DIAGNOSIS — G8929 Other chronic pain: Secondary | ICD-10-CM

## 2017-07-08 MED FILL — MAVYRET 100-40 MG TABS: 100-40 | 28 days supply | Qty: 84 | Fill #0

## 2017-07-08 NOTE — Therapy (Signed)
[facility] [address] [place], [place], [address] Phone: [phone]   Fax:  [phone]  Physical Therapy Treatment  Patient Details  Name: Juan Malone MRN: [id number] Date of Birth: 02-10-1961 Referring Provider: Ramond Marrow MD   Encounter Date: 07/08/2017  PT End of Session - 07/08/17 0845    Visit Number  22    Number of Visits  25    Date for PT Re-Evaluation  07/22/17    Authorization Type  Medicaid 6 visits 4/2-4/22    Authorization - Visit Number  3    Authorization - Number of Visits  6    PT Start Time  0806    PT Stop Time  0845    PT Time Calculation (min)  39 min    Activity Tolerance  Patient tolerated treatment well    Behavior During Therapy  [facility] for tasks assessed/performed       Past Medical History:  Diagnosis Date  . Asthma   . Essential hypertension 06/02/2017  . Hepatitis C virus infection without hepatic coma 06/10/2017  . PONV (postoperative nausea and vomiting)     Past Surgical History:  Procedure Laterality Date  . HAND SURGERY    . ORIF PATELLA Right 02/23/2017   Procedure: OPEN REDUCTION INTERNAL (ORIF) FIXATION PATELLA;  Surgeon: Bjorn Pippin, MD;  Location: [facility];  Service: Orthopedics;  Laterality: Right;  . SHOULDER SURGERY      There were no vitals filed for this visit.  Subjective Assessment - 07/08/17 0808    Subjective  I am tired, I barely got any sleep last night. My knee is still popping on me, it was doing it really badly this morning. It is painful when it pops. It does buckle on me from time to time, this happens quite often.     Patient Stated Goals  to be able walk again, return to work     Currently in Pain?  Yes    Pain Score  6     Pain Location  Knee    Pain Orientation  Right    Pain Descriptors / Indicators  Sharp    Pain Type  Chronic pain    Pain Onset  More than a month ago    Pain Frequency  Intermittent    Aggravating Factors    step ups     Pain Relieving Factors  ice, aspirin     Effect of Pain on Daily Activities  moderate                        [facility] Adult PT Treatment/Exercise - 07/08/17 0001      Knee/Hip Exercises: Stretches   Active Hamstring Stretch  Right;3 reps;30 seconds    ITB Stretch  Right;3 reps;30 seconds      Knee/Hip Exercises: Standing   Heel Raises  15 reps;Both heel and toe     Forward Lunges  Right;1 set;15 reps 8 inch box     Forward Step Up  Right attempted, unable due to pain today       Knee/Hip Exercises: Supine   Short Arc Quad Sets  1 set;15 reps 2#     Bridges  Both;15 reps single leg bridge with R LE only     Straight Leg Raises  Right;1 set;15 reps 3 second holds at top of range     Other Supine Knee/Hip Exercises  supine clams red TB 1x15  Knee/Hip Exercises: Sidelying   Other Sidelying Knee/Hip Exercises  Rt side plank, knee flexed 5x5-10 second holds       Knee/Hip Exercises: Prone   Hip Extension  15 reps 3s hold, quad set and hip extension combo     Other Prone Exercises  fire hydrants/dog leg lifts 1x15 B       Manual Therapy   Manual Therapy  Soft tissue mobilization    Manual therapy comments  separate from all other services     Soft tissue mobilization  STM/MFR R lateral quads in supine              PT Education - 07/08/17 0845    Education provided  No       PT Short Term Goals - 05/10/17 1113      PT SHORT TERM GOAL #1   Title  pt to be I with initial HEP     Time  4    Period  Weeks    Status  Achieved      PT SHORT TERM GOAL #2   Title  pt to increase knee flexion to >/= 90 degrees to promote functional therapuetic progression    Time  4    Period  Weeks    Status  Achieved      PT SHORT TERM GOAL #3   Title  pt reduce R knee edema by >/= 1cm to promote knee mobility and reduce pain     Time  4    Period  Weeks    Status  Achieved        PT Long Term Goals - 06/17/17 1109      PT LONG TERM GOAL #1    Title  increaes knee mobility to >/= -2 to 120 degrees to promote functional and efficent gait pattern     Baseline  0-110 AROM    Time  4    Period  Weeks    Status  On-going      PT LONG TERM GOAL #2   Title  pt to increase R LE strength to >/= 4/5 to promote hip/ knee stability for walking/ standing ADLs    Baseline  4+/5    Time  4    Period  Weeks    Status  Achieved      PT LONG TERM GOAL #3   Title  pt to be able to sit/ stand and walk for >/= 45 min with <=2/10 pain and no AD for functional endurnace required for ADLs     Baseline  limited to 30 minutes prior to 5/10 pain requiring rest break.     Time  8    Period  Weeks    Status  On-going      PT LONG TERM GOAL #4   Title  increase FOTO score to </= 45% limited to demo improvement in function    Baseline  65% limitation    Time  8    Period  Weeks    Status  On-going            Plan - 07/08/17 0846    Clinical Impression Statement  Patient arrives stating that his knee continues to hurt, it continues to have painful popping. Focused most of today's session on functional strengthening for R LE and proximal musculature, also attempted to progress CKC based exercises as tolerated today. Continue to note significant functional weakness in R LE as well as general compensation patterns likely  contributing to pain patterns as well as significant soft tissue limitations lateral R thigh also likely contributing to pain.     PT Frequency  2x / week    PT Duration  3 weeks    PT Treatment/Interventions  ADLs/Self Care Home Management;Electrical Stimulation;Iontophoresis 4mg /ml Dexamethasone;Moist Heat;Ultrasound;Gait training;Stair training;Therapeutic activities;Therapeutic exercise;Manual techniques;Taping;Passive range of motion;Scar mobilization;Balance training;Neuromuscular re-education;Cryotherapy;Vasopneumatic Device    PT Next Visit Plan  continue/progress with CKC strengthening, SLS exercises, reviewed hip flexor  stretch, gait training, stair training.  continue STM.     PT Home Exercise Plan  standing weight shifting, quad set, glute set and heel slides, prone hamstring curls, sidelying hip abduction. SLR; supine HSS, ITB stretch, fire hydrant; tandem & NBOS a/p weight shift, prone quad set+hip ext, Rt side plank    Consulted and Agree with Plan of Care  Patient       Patient will benefit from skilled therapeutic intervention in order to improve the following deficits and impairments:  Pain, Abnormal gait, Decreased endurance, Decreased activity tolerance, Decreased balance, Decreased range of motion, Impaired flexibility, Difficulty walking, Decreased strength, Decreased scar mobility, Improper body mechanics, Increased edema  Visit Diagnosis: Chronic pain of right knee  Stiffness of right knee, not elsewhere classified  Localized edema  Other abnormalities of gait and mobility  Muscle weakness (generalized)     Problem List Patient Active Problem List   Diagnosis Date Noted  . Hepatitis C virus infection without hepatic coma 06/10/2017  . Essential hypertension 06/02/2017  . Mild intermittent asthma without complication 06/02/2017  . Fracture of patella with routine healing, right, closed 02/19/2017    Nedra HaiKristen Dahna Hattabaugh PT, DPT, CBIS  Supplemental Physical Therapist [facility]   Pager [phone]   [facility] [address] [place], [place], [address] Phone: [phone]   Fax:  [phone]  Name: Juan Malone MRN: [id number] Date of Birth: 10-14-1960

## 2017-07-08 NOTE — Telephone Encounter (Signed)
Patient has been approved for 8 weeks of Mavyret for his Hepatitis C infection.  Called and spoke to patient on the phone and counseled on how to take Mavyret - 3 pills once per day (do not separate them) with a meal, try to take around the same time each day, and try not to miss any doses.  Counseled on potential side effects such as nausea, headache, and fatigue.  He will come and see us in 3-4 weeks for adherence/toleration counseling and labs.

## 2017-07-12 ENCOUNTER — Encounter: Payer: Self-pay | Admitting: Physical Therapy

## 2017-07-12 ENCOUNTER — Ambulatory Visit: Payer: Medicaid Other | Admitting: Physical Therapy

## 2017-07-12 DIAGNOSIS — M6281 Muscle weakness (generalized): Secondary | ICD-10-CM

## 2017-07-12 DIAGNOSIS — M25561 Pain in right knee: Secondary | ICD-10-CM | POA: Diagnosis not present

## 2017-07-12 DIAGNOSIS — M25661 Stiffness of right knee, not elsewhere classified: Secondary | ICD-10-CM

## 2017-07-12 DIAGNOSIS — R2689 Other abnormalities of gait and mobility: Secondary | ICD-10-CM

## 2017-07-12 DIAGNOSIS — G8929 Other chronic pain: Secondary | ICD-10-CM

## 2017-07-12 NOTE — Therapy (Signed)
[facility] [address] [place], [place], [address] Phone: [phone]   Fax:  [phone]  Physical Therapy Treatment/ERO  Patient Details  Name: Juan Malone MRN: [id number] Date of Birth: 24-Sep-1960 Referring Provider: Ramond Marrowax Varkey, MD   Encounter Date: 07/12/2017  PT End of Session - 07/12/17 0845    Visit Number  23    Number of Visits  25    Date for PT Re-Evaluation  07/22/17    Authorization Type  Medicaid 6 visits 4/2-4/22    Authorization - Visit Number  4    Authorization - Number of Visits  6    PT Start Time  0845    PT Stop Time  0929    PT Time Calculation (min)  44 min    Activity Tolerance  Patient tolerated treatment well    Behavior During Therapy  [facility] for tasks assessed/performed       Past Medical History:  Diagnosis Date  . Asthma   . Essential hypertension 06/02/2017  . Hepatitis C virus infection without hepatic coma 06/10/2017  . PONV (postoperative nausea and vomiting)     Past Surgical History:  Procedure Laterality Date  . HAND SURGERY    . ORIF PATELLA Right 02/23/2017   Procedure: OPEN REDUCTION INTERNAL (ORIF) FIXATION PATELLA;  Surgeon: Bjorn PippinVarkey, Dax T, MD;  Location: [facility];  Service: Orthopedics;  Laterality: Right;  . SHOULDER SURGERY      There were no vitals filed for this visit.  Subjective Assessment - 07/12/17 0846    Subjective  It's doing pretty good this morning.     How long can you sit comfortably?  15-20 min (improved from 5 min)    How long can you stand comfortably?  10-15 min (declined from 15-20 min)    How long can you walk comfortably?  about 1 mile without use of AD    Diagnostic tests  x-ray 03/04/2017    Patient Stated Goals  to be able walk again, return to work     Currently in Pain?  Yes    Pain Score  5     Pain Location  Knee    Pain Orientation  Right    Pain Descriptors / Indicators  Aching    Aggravating Factors   steps    Pain  Relieving Factors  rest, ice         [facility] PT Assessment - 07/12/17 0001      Assessment   Medical Diagnosis  s/p R patellar ORIF    Referring Provider  Ramond Marrowax Varkey, MD    Onset Date/Surgical Date  02/23/17      Observation/Other Assessments   Focus on Therapeutic Outcomes (FOTO)   59% limitation      AROM   Right Knee Flexion  114      Strength   Strength Assessment Site  Hip pain with all MMT-lateral patella "It's underneath"    Right/Left Hip  Right    Right Hip Flexion  5/5    Right Hip Extension  5/5    Right Hip ABduction  5/5    Left Knee Flexion  5/5 lateral knee pain    Left Knee Extension  5/5 mid-patellar pain                   [facility] Adult PT Treatment/Exercise - 07/12/17 0001      Knee/Hip Exercises: Standing   Heel Raises  Other (comment) 10s isometric  holds    SLS  tennis ball bouncing    Other Standing Knee Exercises  mini squat iso holds with ball bw knees             PT Education - 07/12/17 1024    Education provided  Yes    Education Details  goals, progress & further goals, use of knee model to discuss pain, alignment/pull of musculature, HEP, POC    Person(s) Educated  Patient    Methods  Explanation    Comprehension  Verbalized understanding;Need further instruction       PT Short Term Goals - 05/10/17 1113      PT SHORT TERM GOAL #1   Title  pt to be I with initial HEP     Time  4    Period  Weeks    Status  Achieved      PT SHORT TERM GOAL #2   Title  pt to increase knee flexion to >/= 90 degrees to promote functional therapuetic progression    Time  4    Period  Weeks    Status  Achieved      PT SHORT TERM GOAL #3   Title  pt reduce R knee edema by >/= 1cm to promote knee mobility and reduce pain     Time  4    Period  Weeks    Status  Achieved        PT Long Term Goals - 07/12/17 1610      PT LONG TERM GOAL #1   Title  increaes knee mobility to >/= -2 to 120 degrees to promote functional and efficent  gait pattern     Baseline  0-114    Time  5 to accomodate for MCD authorization period    Period  Weeks    Status  On-going    Target Date  08/16/17      PT LONG TERM GOAL #2   Title  pt to increase R LE strength to >/= 4/5 to promote hip/ knee stability for walking/ standing ADLs    Baseline  4+/5    Status  Achieved      PT LONG TERM GOAL #3   Title  pt to be able to sit/ stand and walk for >/= 45 min with <=2/10 pain and no AD for functional endurnace required for ADLs     Baseline  able to walk approx 30 min, pain up to 7/10    Time  5    Period  Weeks    Status  New    Target Date  08/16/17      PT LONG TERM GOAL #4   Title  increase FOTO score to </= 45% limited to demo improvement in function    Baseline  59% limitation (improved from 65% limitation)    Time  5    Period  Weeks    Status  On-going    Target Date  08/16/17            Plan - 07/12/17 1025    Clinical Impression Statement  Pt has made progress toward goals and achieved some- see baselines for specifics- but would benefit from further skilled PT to improve functional endurance and ability and decrease pain. Good strength noted in straight plane MMT but poor patellar tracking in functional movements and with fatigue leading to patello-femoral friction and pain on lateral aspect of patella. Utilized more static movements with longer duration holds to challenge strength  and endurance while decrease patello-femoral movement. Pt will continue to benefit from skilled PT in order to challenge functional endurance and control to continue progressing toward long term goals and create appropriate long term HEP.     Rehab Potential  Good    PT Frequency  1x / week    PT Duration  4 weeks    PT Treatment/Interventions  ADLs/Self Care Home Management;Electrical Stimulation;Iontophoresis 4mg /ml Dexamethasone;Moist Heat;Ultrasound;Gait training;Stair training;Therapeutic activities;Therapeutic exercise;Manual  techniques;Taping;Passive range of motion;Scar mobilization;Balance training;Neuromuscular re-education;Cryotherapy;Vasopneumatic Device    PT Next Visit Plan  continue/progress with CKC strengthening, SLS exercises, reviewed hip flexor stretch, gait training, stair training.  continue STM.     PT Home Exercise Plan  standing weight shifting, quad set, glute set and heel slides, prone hamstring curls, sidelying hip abduction. SLR; supine HSS, ITB stretch, fire hydrant; tandem & NBOS a/p weight shift, prone quad set+hip ext, Rt side plank; SLS ball bounce, heel raise with holds, iso squats    Consulted and Agree with Plan of Care  Patient       Patient will benefit from skilled therapeutic intervention in order to improve the following deficits and impairments:  Pain, Abnormal gait, Decreased endurance, Decreased activity tolerance, Decreased balance, Decreased range of motion, Impaired flexibility, Difficulty walking, Decreased strength, Decreased scar mobility, Improper body mechanics, Increased edema  Visit Diagnosis: Chronic pain of right knee - Plan: PT plan of care cert/re-cert  Stiffness of right knee, not elsewhere classified - Plan: PT plan of care cert/re-cert  Other abnormalities of gait and mobility - Plan: PT plan of care cert/re-cert  Muscle weakness (generalized) - Plan: PT plan of care cert/re-cert     Problem List Patient Active Problem List   Diagnosis Date Noted  . Hepatitis C virus infection without hepatic coma 06/10/2017  . Essential hypertension 06/02/2017  . Mild intermittent asthma without complication 06/02/2017  . Fracture of patella with routine healing, right, closed 02/19/2017    Aarya Quebedeaux C. Estell Dillinger PT, DPT 07/12/17 10:44 AM   [facility] [address] [place], [place], [address] Phone: [phone]   Fax:  [phone]  Name: Juan Malone MRN: [id number] Date of Birth: 1960-10-04

## 2017-07-13 NOTE — Telephone Encounter (Signed)
Great. Thank you.

## 2017-07-19 ENCOUNTER — Ambulatory Visit: Payer: Medicaid Other | Admitting: Family Medicine

## 2017-07-20 ENCOUNTER — Ambulatory Visit: Payer: Medicaid Other | Admitting: Physical Therapy

## 2017-07-26 ENCOUNTER — Telehealth: Payer: Self-pay | Admitting: Physical Therapy

## 2017-07-26 ENCOUNTER — Ambulatory Visit: Payer: Medicaid Other | Attending: Orthopaedic Surgery | Admitting: Physical Therapy

## 2017-07-26 DIAGNOSIS — M25561 Pain in right knee: Secondary | ICD-10-CM | POA: Insufficient documentation

## 2017-07-26 DIAGNOSIS — M6281 Muscle weakness (generalized): Secondary | ICD-10-CM | POA: Insufficient documentation

## 2017-07-26 DIAGNOSIS — R600 Localized edema: Secondary | ICD-10-CM | POA: Insufficient documentation

## 2017-07-26 DIAGNOSIS — R2689 Other abnormalities of gait and mobility: Secondary | ICD-10-CM | POA: Insufficient documentation

## 2017-07-26 DIAGNOSIS — M25661 Stiffness of right knee, not elsewhere classified: Secondary | ICD-10-CM | POA: Insufficient documentation

## 2017-07-26 DIAGNOSIS — G8929 Other chronic pain: Secondary | ICD-10-CM | POA: Insufficient documentation

## 2017-07-26 NOTE — Telephone Encounter (Signed)
LVM regarding missed appointment today and when the next scheduled appointment is. If he is unable to make it to the next appointment then he can call and cancel or reschedule.

## 2017-08-02 ENCOUNTER — Telehealth: Payer: Self-pay | Admitting: Pharmacist

## 2017-08-02 ENCOUNTER — Ambulatory Visit: Payer: Medicaid Other | Admitting: Physical Therapy

## 2017-08-02 ENCOUNTER — Telehealth: Payer: Self-pay

## 2017-08-02 DIAGNOSIS — J4520 Mild intermittent asthma, uncomplicated: Secondary | ICD-10-CM

## 2017-08-02 MED ORDER — ALBUTEROL SULFATE HFA 108 (90 BASE) MCG/ACT IN AERS: 2.0000 | INHALATION_SPRAY | RESPIRATORY_TRACT | 2 refills | Status: DC | PRN

## 2017-08-02 NOTE — Telephone Encounter (Signed)
Refill for albuterol sent into pharmacy. Thanks!  

## 2017-08-02 NOTE — Telephone Encounter (Signed)
Patient is on Mavyret x 8 weeks for his Hep C infection.  He started [facility] ~4/22.  The pharmacy reached out to me and let me know that the patient stated he missed a week of medications.  I called patient to discuss. He had a family emergency and was rushed out of town and forgot to grab his [employer].  He took it last Friday AM and not again until the next Friday PM when he returned home.  Discussed how imperative it was to not miss doses and that his chances for cure now are iffy. He sees me tomorrow morning and we will get labs. Instructed him not to miss anymore doses until he is done.

## 2017-08-03 ENCOUNTER — Other Ambulatory Visit: Payer: Self-pay

## 2017-08-03 ENCOUNTER — Ambulatory Visit (INDEPENDENT_AMBULATORY_CARE_PROVIDER_SITE_OTHER): Payer: Medicaid Other | Admitting: Pharmacist

## 2017-08-03 DIAGNOSIS — Z23 Encounter for immunization: Secondary | ICD-10-CM

## 2017-08-03 DIAGNOSIS — J4520 Mild intermittent asthma, uncomplicated: Secondary | ICD-10-CM

## 2017-08-03 DIAGNOSIS — I10 Essential (primary) hypertension: Secondary | ICD-10-CM

## 2017-08-03 DIAGNOSIS — B182 Chronic viral hepatitis C: Secondary | ICD-10-CM

## 2017-08-03 LAB — COMPREHENSIVE METABOLIC PANEL
AG RATIO: 0.9 (calc) — AB (ref 1.0–2.5)
ALBUMIN MSPROF: 4 g/dL (ref 3.6–5.1)
ALKALINE PHOSPHATASE (APISO): 87 U/L (ref 40–115)
ALT: 45 U/L (ref 9–46)
AST: 44 U/L — ABNORMAL HIGH (ref 10–35)
BILIRUBIN TOTAL: 0.5 mg/dL (ref 0.2–1.2)
BUN: 13 mg/dL (ref 7–25)
CALCIUM: 9.5 mg/dL (ref 8.6–10.3)
CHLORIDE: 99 mmol/L (ref 98–110)
CO2: 29 mmol/L (ref 20–32)
Creat: 0.77 mg/dL (ref 0.70–1.33)
GLOBULIN: 4.3 g/dL — AB (ref 1.9–3.7)
Glucose, Bld: 96 mg/dL (ref 65–99)
POTASSIUM: 4 mmol/L (ref 3.5–5.3)
SODIUM: 135 mmol/L (ref 135–146)
TOTAL PROTEIN: 8.3 g/dL — AB (ref 6.1–8.1)

## 2017-08-03 MED ORDER — ALBUTEROL SULFATE HFA 108 (90 BASE) MCG/ACT IN AERS: 2.0000 | INHALATION_SPRAY | RESPIRATORY_TRACT | 2 refills | Status: DC | PRN

## 2017-08-03 MED ORDER — AMLODIPINE BESYLATE 5 MG PO TABS: 5.0000 mg | ORAL_TABLET | Freq: Every day | ORAL | 0 refills | Status: DC

## 2017-08-03 NOTE — Progress Notes (Signed)
HPI: Juan Malone is a 57 y.o. male who presents to the pharmacy clinic today to follow-up on his Hepatitis C disease. He has genotype 1 diseases with F2/F3 Fibrosis.    Lab Results  Component Value Date   HCVGENOTYPE 1 06/21/2017    Allergies: Allergies  Allergen Reactions  . Penicillins Anaphylaxis    Has patient had a PCN reaction causing immediate rash, facial/tongue/throat swelling, SOB or lightheadedness with hypotension: yes Has patient had a PCN reaction causing severe rash involving mucus membranes or skin necrosis: no Has patient had a PCN reaction that required hospitalization: yes Has patient had a PCN reaction occurring within the last 10 years: no If all of the above answers are "NO", then may proceed with Cephalosporin use.     Vitals:    Past Medical History: Past Medical History:  Diagnosis Date  . Asthma   . Essential hypertension 06/02/2017  . Hepatitis C virus infection without hepatic coma 06/10/2017  . PONV (postoperative nausea and vomiting)     Social History: Social History   Socioeconomic History  . Marital status: Single    Spouse name: Not on file  . Number of children: Not on file  . Years of education: Not on file  . Highest education level: Not on file  Occupational History  . Not on file  Social Needs  . Financial resource strain: Not on file  . Food insecurity:    Worry: Not on file    Inability: Not on file  . Transportation needs:    Medical: Not on file    Non-medical: Not on file  Tobacco Use  . Smoking status: Current Every Day Smoker    Packs/day: 0.25    Years: 5.00    Pack years: 1.25    Types: Cigarettes  . Smokeless tobacco: Never Used  Substance and Sexual Activity  . Alcohol use: Yes    Alcohol/week: 3.6 oz    Types: 6 Cans of beer per week    Comment: occassionally weekends   . Drug use: Yes    Types: Marijuana    Comment: weekends  . Sexual activity: Not Currently  Lifestyle  . Physical activity:   Days per week: Not on file    Minutes per session: Not on file  . Stress: Not on file  Relationships  . Social connections:    Talks on phone: Not on file    Gets together: Not on file    Attends religious service: Not on file    Active member of club or organization: Not on file    Attends meetings of clubs or organizations: Not on file    Relationship status: Not on file  Other Topics Concern  . Not on file  Social History Narrative   ** Merged History Encounter **        Labs: Hep B S Ab (no units)  Date Value  06/21/2017 NON-REACTIVE   Hepatitis B Surface Ag (no units)  Date Value  06/21/2017 NON-REACTIVE    Lab Results  Component Value Date   HCVGENOTYPE 1 06/21/2017    Hepatitis C RNA quantitative Latest Ref Rng & Units 06/21/2017  HCV Quantitative Log NOT DETECT Log IU/mL 6.01(H)    AST (IU/L)  Date Value  05/28/2017 94 (H)   ALT  Date Value  06/21/2017 170 U/L (H)  05/28/2017 108 IU/L (H)   INR (no units)  Date Value  06/21/2017 1.1    CrCl: CrCl cannot be calculated (Patient's  most recent lab result is older than the maximum 21 days allowed.).  Fibrosis Score: F2/F3 as assessed by Elastography   Child-Pugh Score: A  Previous Treatment Regimen: None  Assessment: Juan Malone is here to follow-up on his hepatitis C disease. He started 8 weeks of Mavyret on 07/12/17. Recently he missed one week of therapy from Friday May 3rd to Thursday May 9th when he went to help his daughter with a family emergency and did not bring his medications. We talked to him about how this could ruin his chances of cure with this regimen and that we would have to monitor his labs closely. He states that he has now started his second box as of last night. If that is the case, it seems as though he may have missed more than a week of doses.   Juan Malone does report some fatigue with Mavyret but his headaches have resolved. We administered his second Hepatitis B vaccine today and  Pneumovax 23.   We will re-collect his HCV viral load and CMET today. We re-emphasized the importance of not missing any more doses for his treatment to be successful and to call the clinic if he is ever without his medications again.  Recommendations: - Continue taking Mavyret every night with food - Hep B # 2 and Pneumovax 23 today - HCV viral load and CMET today - F/U with pharmacy on 09/16/17 for EOT   Della Goo, Pharm.D., BCPS Clinical Infectious Disease Pharmacist [facility] 08/03/2017, 9:03 AM

## 2017-08-04 ENCOUNTER — Other Ambulatory Visit: Payer: Self-pay

## 2017-08-04 DIAGNOSIS — J4520 Mild intermittent asthma, uncomplicated: Secondary | ICD-10-CM

## 2017-08-04 MED ORDER — ALBUTEROL SULFATE HFA 108 (90 BASE) MCG/ACT IN AERS: 2.0000 | INHALATION_SPRAY | RESPIRATORY_TRACT | 2 refills | Status: DC | PRN

## 2017-08-05 ENCOUNTER — Encounter: Payer: Self-pay | Admitting: Physical Therapy

## 2017-08-05 ENCOUNTER — Ambulatory Visit: Payer: Medicaid Other | Admitting: Physical Therapy

## 2017-08-05 DIAGNOSIS — R600 Localized edema: Secondary | ICD-10-CM

## 2017-08-05 DIAGNOSIS — M25661 Stiffness of right knee, not elsewhere classified: Secondary | ICD-10-CM | POA: Diagnosis present

## 2017-08-05 DIAGNOSIS — R2689 Other abnormalities of gait and mobility: Secondary | ICD-10-CM | POA: Diagnosis present

## 2017-08-05 DIAGNOSIS — G8929 Other chronic pain: Secondary | ICD-10-CM

## 2017-08-05 DIAGNOSIS — M25561 Pain in right knee: Principal | ICD-10-CM

## 2017-08-05 DIAGNOSIS — M6281 Muscle weakness (generalized): Secondary | ICD-10-CM | POA: Diagnosis present

## 2017-08-05 LAB — HEPATITIS C RNA QUANTITATIVE
HCV Quantitative Log: <1.18 Log IU/mL — AB
HCV RNA, PCR, QN: <15 IU/mL — AB

## 2017-08-05 NOTE — Therapy (Signed)
[facility] [address] [place], [place], [address] Phone: [phone]   Fax:  [phone]  Physical Therapy Treatment  Patient Details  Name: Juan Malone MRN: [id number] Date of Birth: 03-27-1960 Referring Provider: Ramond Marrow, MD   Encounter Date: 08/05/2017  PT End of Session - 08/05/17 0717    Visit Number  24    Number of Visits  27    Date for PT Re-Evaluation  08/16/17    Authorization Type  Medicaid 4 more visits      Authorization Time Period  07/19/2017-08/15/2017    Authorization - Visit Number  1    Authorization - Number of Visits  4    PT Start Time  0715    PT Stop Time  0755    PT Time Calculation (min)  40 min       Past Medical History:  Diagnosis Date  . Asthma   . Essential hypertension 06/02/2017  . Hepatitis C virus infection without hepatic coma 06/10/2017  . PONV (postoperative nausea and vomiting)     Past Surgical History:  Procedure Laterality Date  . HAND SURGERY    . ORIF PATELLA Right 02/23/2017   Procedure: OPEN REDUCTION INTERNAL (ORIF) FIXATION PATELLA;  Surgeon: Bjorn Pippin, MD;  Location:  [facility];  Service: Orthopedics;  Laterality: Right;  . SHOULDER SURGERY      There were no vitals filed for this visit.      [facility] PT Assessment - 08/05/17 0001      AROM   Right Knee Flexion  114      PROM   Right Knee Flexion  116      Strength   Right/Left Knee  Right    Left Knee Flexion  5/5 lateral knee pain    Left Knee Extension  5/5 mid-patellar pain                   [facility] Adult PT Treatment/Exercise - 08/05/17 0001      Knee/Hip Exercises: Stretches   Knee: Self-Stretch to increase Flexion  30 seconds;3 reps assisted heel slide     Other Knee/Hip Stretches  Prone knee flexion with strap 5 x 30 sec      Knee/Hip Exercises: Aerobic   Recumbent Bike  7 min L3      Knee/Hip Exercises: Standing   Forward Step Up  10 reps;Step Height:  6";Hand Hold: 1 pain    Step Down  Step Height: 6";10 reps also small single leg squats from front of step x10       Knee/Hip Exercises: Seated   Stool Scoot - Round Trips  3 round trips 40 ft each       Knee/Hip Exercises: Supine   Straight Leg Raises  1 set;10 reps 4#      Knee/Hip Exercises: Sidelying   Hip ABduction  10 reps 4#      Knee/Hip Exercises: Prone   Hamstring Curl  20 reps 4#    Hip Extension  15 reps 3s hold, quad set and hip extension combo     Hip Extension Limitations  4#               PT Short Term Goals - 05/10/17 1113      PT SHORT TERM GOAL #1   Title  pt to be I with initial HEP     Time  4    Period  Weeks    Status  Achieved      PT SHORT TERM GOAL #2   Title  pt to increase knee flexion to >/= 90 degrees to promote functional therapuetic progression    Time  4    Period  Weeks    Status  Achieved      PT SHORT TERM GOAL #3   Title  pt reduce R knee edema by >/= 1cm to promote knee mobility and reduce pain     Time  4    Period  Weeks    Status  Achieved        PT Long Term Goals - 08/05/17 4098      PT LONG TERM GOAL #3   Title  pt to be able to sit/ stand and walk for >/= 45 min with <=2/10 pain and no AD for functional endurnace required for ADLs     Baseline  able to walk 45 minutes with pain up to 5/10, cannot sit 45 minutes without stretching or standing/repositioning.     Time  5    Period  Weeks    Status  On-going            Plan - 08/05/17 0719    Clinical Impression Statement  Pt reports the knee is doing fairly good. He has been unable to attend the last 2 weeks due to transportation.  He reports stiffness in the mornings relieved by getting up and moving, doing some knee bends. He reports rainy weather makes his knee stiff and achey. He reports 7/10 pain with step ups and step downs as well as intermittent buckling of knee during step exercises and with walking. He continues to wear his ace wrap for compresson/  stability and wears his knee brace when outside of home. His flexion AROM is unchanged so we focused ROM and reviewed his flexion stretches. He gained 2 degrees of motion after stretching. With step ups he has pain and popping behind knee cap. Began small single legs squats stepping forward off step. Good motor control with these. He has one more visit in his authorization period.     PT Next Visit Plan  One more visit in auth period- review HEP, likely DC ; continue/progress with CKC strengthening, SLS exercises, reviewed hip flexor stretch, gait training, stair training.  continue STM.     PT Home Exercise Plan  standing weight shifting, quad set, glute set and heel slides, prone hamstring curls, sidelying hip abduction. SLR; supine HSS, ITB stretch, fire hydrant; tandem & NBOS a/p weight shift, prone quad set+hip ext, Rt side plank; SLS ball bounce, heel raise with holds, iso squats    Consulted and Agree with Plan of Care  Patient       Patient will benefit from skilled therapeutic intervention in order to improve the following deficits and impairments:  Pain, Abnormal gait, Decreased endurance, Decreased activity tolerance, Decreased balance, Decreased range of motion, Impaired flexibility, Difficulty walking, Decreased strength, Decreased scar mobility, Improper body mechanics, Increased edema  Visit Diagnosis: Chronic pain of right knee  Stiffness of right knee, not elsewhere classified  Other abnormalities of gait and mobility  Muscle weakness (generalized)  Localized edema     Problem List Patient Active Problem List   Diagnosis Date Noted  . Hepatitis C virus infection without hepatic coma 06/10/2017  . Essential hypertension 06/02/2017  . Mild intermittent asthma without complication 06/02/2017  . Fracture of patella with routine healing, right, closed 02/19/2017    Sitlali Koerner, Orpah Cobb, PTA  08/05/2017, 8:59 AM  [facility] [address] [place], [place], [address] Phone: [phone]   Fax:  [phone]  Name: Juan Malone MRN: [id number] Date of Birth: 06-05-1960

## 2017-08-09 ENCOUNTER — Ambulatory Visit: Payer: Medicaid Other

## 2017-08-11 ENCOUNTER — Telehealth: Payer: Self-pay

## 2017-08-11 NOTE — Telephone Encounter (Signed)
Patient is requesting a refill for diclofenac . This is not on his current list. He states he has a bottle that was last prescribed by you for this. Please advise if this can be refilled. [facility]. Thanks!

## 2017-08-12 ENCOUNTER — Other Ambulatory Visit: Payer: Self-pay | Admitting: Family Medicine

## 2017-08-12 ENCOUNTER — Encounter: Payer: Self-pay | Admitting: Physical Therapy

## 2017-08-12 ENCOUNTER — Ambulatory Visit: Payer: Medicaid Other | Admitting: Physical Therapy

## 2017-08-12 DIAGNOSIS — R600 Localized edema: Secondary | ICD-10-CM

## 2017-08-12 DIAGNOSIS — G8929 Other chronic pain: Secondary | ICD-10-CM

## 2017-08-12 DIAGNOSIS — M25561 Pain in right knee: Principal | ICD-10-CM

## 2017-08-12 DIAGNOSIS — R2689 Other abnormalities of gait and mobility: Secondary | ICD-10-CM

## 2017-08-12 DIAGNOSIS — M6281 Muscle weakness (generalized): Secondary | ICD-10-CM

## 2017-08-12 DIAGNOSIS — M25661 Stiffness of right knee, not elsewhere classified: Secondary | ICD-10-CM

## 2017-08-12 DIAGNOSIS — I10 Essential (primary) hypertension: Secondary | ICD-10-CM

## 2017-08-12 MED ORDER — DICLOFENAC SODIUM 75 MG PO TBEC: 75.0000 mg | DELAYED_RELEASE_TABLET | Freq: Two times a day (BID) | ORAL | 0 refills | Status: DC

## 2017-08-12 NOTE — Therapy (Signed)
[place], [place], [address] Phone: [phone]   Fax:  [phone]  Physical Therapy Treatment / Discharge Summary  Patient Details  Name: Juan Malone MRN: [id number] Date of Birth: [birth year] Referring Provider: Ophelia Charter, MD   Encounter Date: 08/12/2017  PT End of Session - 08/12/17 0801    Visit Number  25    Number of Visits  27    Date for PT Re-Evaluation  08/16/17    PT Start Time  0801    PT Stop Time  7741 shortened session due to discharge    PT Time Calculation (min)  34 min    Activity Tolerance  Patient tolerated treatment well    Behavior During Therapy  [facility] for tasks assessed/performed       Past Medical History:  Diagnosis Date  . Asthma   . Essential hypertension 06/02/2017  . Hepatitis C virus infection without hepatic coma 06/10/2017  . PONV (postoperative nausea and vomiting)     Past Surgical History:  Procedure Laterality Date  . HAND SURGERY    . ORIF PATELLA Right 02/23/2017   Procedure: OPEN REDUCTION INTERNAL (ORIF) FIXATION PATELLA;  Surgeon: Hiram Gash, MD;  Location: [place];  Service: Orthopedics;  Laterality: Right;  . SHOULDER SURGERY      There were no vitals filed for this visit.  Subjective Assessment - 08/12/17 0801    Subjective  "         [facility] PT Assessment - 08/12/17 0812      Observation/Other Assessments   Focus on Therapeutic Outcomes (FOTO)   60% limited      AROM   Right Knee Extension  0    Right Knee Flexion  118      Strength   Left Knee Flexion  5/5    Left Knee Extension  5/5                   [facility] Adult PT Treatment/Exercise - 08/12/17 0814      Knee/Hip Exercises: Stretches   Quad Stretch  3 reps;30 seconds PNF     Gastroc Stretch  2 reps;30 seconds slant board      Knee/Hip Exercises: Standing   Hip Abduction  1 set;10 reps;Knee straight with red theraband    Hip Extension  1  set;Knee straight;10 reps with red theraband      Manual Therapy   Manual therapy comments  MTPR along proximal to distal quad x 3    Soft tissue mobilization  DTM along quad             PT Education - 08/12/17 0835    Education provided  Yes    Education Details  reviewed previously provided HEP, MTPR release techniques, how to progress strength and endurance. handout for group class.     Person(s) Educated  Patient    Methods  Explanation;Verbal cues;Handout    Comprehension  Verbalized understanding;Verbal cues required       PT Short Term Goals - 05/10/17 1113      PT SHORT TERM GOAL #1   Title  pt to be I with initial HEP     Time  4    Period  Weeks    Status  Achieved      PT SHORT TERM GOAL #2   Title  pt to increase knee flexion to >/= 90 degrees to promote functional therapuetic progression  Time  4    Period  Weeks    Status  Achieved      PT SHORT TERM GOAL #3   Title  pt reduce R knee edema by >/= 1cm to promote knee mobility and reduce pain     Time  4    Period  Weeks    Status  Achieved        PT Long Term Goals - 08/12/17 0820      PT LONG TERM GOAL #1   Title  increaes knee mobility to >/= -2 to 120 degrees to promote functional and efficent gait pattern     Baseline  0 - 118    Period  Weeks    Status  Partially Met      PT LONG TERM GOAL #2   Title  pt to increase R LE strength to >/= 4/5 to promote hip/ knee stability for walking/ standing ADLs    Time  4    Period  Weeks    Status  Achieved      PT LONG TERM GOAL #3   Title  pt to be able to sit/ stand and walk for >/= 45 min with <=2/10 pain and no AD for functional endurnace required for ADLs     Baseline  3-4/10 with 45 min     Time  5    Period  Weeks    Status  Partially Met      PT LONG TERM GOAL #4   Title  increase FOTO score to </= 45% limited to demo improvement in function    Baseline  60% limited    Time  5    Period  Weeks    Status  Not Met             Plan - 08/12/17 1497    Clinical Impression Statement  Juan Malone has made great progress with physical therapy increasing knee mobility 0 - 118 degrees. He continues to report 3/10 pain today but states it is continuing to improve. he met or partially met all goals except for LTG #4. He is able to maintain and progress his current level of function and will be discharged f rom PT today.     PT Treatment/Interventions  ADLs/Self Care Home Management;Electrical Stimulation;Iontophoresis 66m/ml Dexamethasone;Moist Heat;Ultrasound;Gait training;Stair training;Therapeutic activities;Therapeutic exercise;Manual techniques;Taping;Passive range of motion;Scar mobilization;Balance training;Neuromuscular re-education;Cryotherapy;Vasopneumatic Device    PT Next Visit Plan  d/C    PT Home Exercise Plan  standing weight shifting, quad set, glute set and heel slides, prone hamstring curls, sidelying hip abduction. SLR; supine HSS, ITB stretch, fire hydrant; tandem & NBOS a/p weight shift, prone quad set+hip ext, Rt side plank; SLS ball bounce, heel raise with holds, iso squats    Consulted and Agree with Plan of Care  Patient       Patient will benefit from skilled therapeutic intervention in order to improve the following deficits and impairments:  Pain, Abnormal gait, Decreased endurance, Decreased activity tolerance, Decreased balance, Decreased range of motion, Impaired flexibility, Difficulty walking, Decreased strength, Decreased scar mobility, Improper body mechanics, Increased edema  Visit Diagnosis: Chronic pain of right knee  Stiffness of right knee, not elsewhere classified  Other abnormalities of gait and mobility  Muscle weakness (generalized)  Localized edema     Problem List Patient Active Problem List   Diagnosis Date Noted  . Hepatitis C virus infection without hepatic coma 06/10/2017  . Essential hypertension 06/02/2017  . Mild intermittent asthma  without complication  27/74/1287  . Fracture of patella with routine healing, right, closed 02/19/2017    [name] 08/12/2017, 8:41 AM  [place], [place], [address] Phone: [phone]   Fax:  [phone]  Name: Juan Malone MRN: [id number] Date of Birth: 1960-08-08         PHYSICAL THERAPY DISCHARGE SUMMARY  Visits from Start of Care: 25  Current functional level related to goals / functional outcomes: See goals, 40% limited   Remaining deficits: Continued pain in the knee rated at 3/10 today. Limited knee flexion with end range pain. See note.    Education / Equipment: HEP, theraband, posture, biomechanics,   Plan: Patient agrees to discharge.  Patient goals were partially met. Patient is being discharged due to being pleased with the current functional level.  ?????         Jaretzy Lhommedieu PT, DPT, LAT, ATC  08/12/17  8:43 AM

## 2017-08-12 NOTE — Progress Notes (Signed)
Meds ordered this encounter  Medications  . diclofenac (VOLTAREN) 75 MG EC tablet    Sig: Take 1 tablet (75 mg total) by mouth 2 (two) times daily.    Dispense:  60 tablet    Refill:  0    Nolon Nations  MSN, FNP-C Patient [facility] [address] [place], [place] [address] [phone]

## 2017-08-13 ENCOUNTER — Telehealth: Payer: Self-pay

## 2017-08-13 NOTE — Telephone Encounter (Signed)
Called and advised patient that medication has been sent into pharmacy. Thanks!

## 2017-08-17 MED FILL — MAVYRET 100-40 MG TABS: 100-40 | 28 days supply | Qty: 84 | Fill #1

## 2017-08-23 ENCOUNTER — Ambulatory Visit: Payer: Medicaid Other | Admitting: Family Medicine

## 2017-08-23 ENCOUNTER — Ambulatory Visit (AMBULATORY_SURGERY_CENTER): Payer: Self-pay | Admitting: *Deleted

## 2017-08-23 ENCOUNTER — Other Ambulatory Visit: Payer: Self-pay

## 2017-08-23 VITALS — Ht 70.0 in | Wt 145.0 lb

## 2017-08-23 DIAGNOSIS — Z1211 Encounter for screening for malignant neoplasm of colon: Secondary | ICD-10-CM

## 2017-08-23 MED ORDER — PEG-KCL-NACL-NASULF-NA ASC-C 140 G PO SOLR: 1.0000 | Freq: Once | ORAL | 0 refills | Status: AC

## 2017-08-23 NOTE — Progress Notes (Signed)
Patient denies any allergies to eggs or soy. Patient has post-op nausea and vomiting with anesthesia. Patient denies any oxygen use at home. Patient denies taking any diet/weight loss medications or blood thinners. EMMI education offered, declined by the pt.

## 2017-08-26 ENCOUNTER — Ambulatory Visit: Payer: Medicaid Other | Admitting: Family Medicine

## 2017-09-06 ENCOUNTER — Encounter: Payer: Medicaid Other | Admitting: Gastroenterology

## 2017-09-16 ENCOUNTER — Ambulatory Visit: Payer: Medicaid Other

## 2017-09-27 ENCOUNTER — Telehealth: Payer: Self-pay | Admitting: Pharmacist

## 2017-09-27 NOTE — Telephone Encounter (Signed)
Juan Malone was being treated for his Hepatitis C infection with 8 weeks of Mavyret. He started around 4/22 but then took it for a week and then missed about a week to a week and a half when he went out of town and forgot to bring it with him.  He was then incarcerated and took about 2 and a half more weeks and stopped. Last dose on June 3rd.  He's now out of jail and coming in Thursday for labs. He asked if I wanted him to start taking it again and I told him no to just stop.

## 2017-09-30 ENCOUNTER — Ambulatory Visit (INDEPENDENT_AMBULATORY_CARE_PROVIDER_SITE_OTHER): Payer: Medicaid Other | Admitting: Pharmacist

## 2017-09-30 DIAGNOSIS — B182 Chronic viral hepatitis C: Secondary | ICD-10-CM

## 2017-09-30 LAB — COMPREHENSIVE METABOLIC PANEL
AG Ratio: 1.1 (calc) (ref 1.0–2.5)
ALKALINE PHOSPHATASE (APISO): 83 U/L (ref 40–115)
ALT: 11 U/L (ref 9–46)
AST: 18 U/L (ref 10–35)
Albumin: 4 g/dL (ref 3.6–5.1)
BILIRUBIN TOTAL: 0.3 mg/dL (ref 0.2–1.2)
BUN/Creatinine Ratio: 16 (calc) (ref 6–22)
BUN: 11 mg/dL (ref 7–25)
CALCIUM: 9.4 mg/dL (ref 8.6–10.3)
CO2: 30 mmol/L (ref 20–32)
Chloride: 102 mmol/L (ref 98–110)
Creat: 0.68 mg/dL — ABNORMAL LOW (ref 0.70–1.33)
Globulin: 3.8 g/dL (calc) — ABNORMAL HIGH (ref 1.9–3.7)
Glucose, Bld: 99 mg/dL (ref 65–99)
Potassium: 4.3 mmol/L (ref 3.5–5.3)
Sodium: 139 mmol/L (ref 135–146)
TOTAL PROTEIN: 7.8 g/dL (ref 6.1–8.1)

## 2017-09-30 LAB — CBC
HEMATOCRIT: 37.8 % — AB (ref 38.5–50.0)
Hemoglobin: 12.4 g/dL — ABNORMAL LOW (ref 13.2–17.1)
MCH: 24.7 pg — ABNORMAL LOW (ref 27.0–33.0)
MCHC: 32.8 g/dL (ref 32.0–36.0)
MCV: 75.3 fL — AB (ref 80.0–100.0)
MPV: 10.2 fL (ref 7.5–12.5)
PLATELETS: 222 10*3/uL (ref 140–400)
RBC: 5.02 10*6/uL (ref 4.20–5.80)
RDW: 14.4 % (ref 11.0–15.0)
WBC: 7.4 10*3/uL (ref 3.8–10.8)

## 2017-09-30 NOTE — Progress Notes (Signed)
HPI: Juan Malone is a 57 y.o. male who presents to the [facility] pharmacy clinic for Hep C follow-up.  He has genotype 1 and F2/F3 fibrosis.  He was approved for Mavyret x 8 weeks but did not finish the course.  Patient Active Problem List   Diagnosis Date Noted  . Hepatitis C virus infection without hepatic coma 06/10/2017  . Essential hypertension 06/02/2017  . Mild intermittent asthma without complication 06/02/2017  . Fracture of patella with routine healing, right, closed 02/19/2017    Patient's Medications  New Prescriptions   No medications on file  Previous Medications   ALBUTEROL (PROVENTIL HFA;VENTOLIN HFA) 108 (90 BASE) MCG/ACT INHALER    Inhale 2 puffs into the lungs every 4 (four) hours as needed for wheezing or shortness of breath.   AMLODIPINE (NORVASC) 5 MG TABLET    Take 1 tablet (5 mg total) by mouth daily.   GABAPENTIN (NEURONTIN) 300 MG CAPSULE    Take 1 capsule (300 mg total) by mouth at bedtime.   GLECAPREVIR-PIBRENTASVIR (MAVYRET) 100-40 MG TABS    Take 3 tablets by mouth daily.  Modified Medications   No medications on file  Discontinued Medications   No medications on file    Allergies: Allergies  Allergen Reactions  . Penicillins Anaphylaxis    Has patient had a PCN reaction causing immediate rash, facial/tongue/throat swelling, SOB or lightheadedness with hypotension: yes Has patient had a PCN reaction causing severe rash involving mucus membranes or skin necrosis: no Has patient had a PCN reaction that required hospitalization: yes Has patient had a PCN reaction occurring within the last 10 years: no If all of the above answers are "NO", then may proceed with Cephalosporin use.     Past Medical History: Past Medical History:  Diagnosis Date  . Asthma   . Essential hypertension 06/02/2017  . Hepatitis C virus infection without hepatic coma 06/10/2017  . PONV (postoperative nausea and vomiting)     Social History: Social History    Socioeconomic History  . Marital status: Single    Spouse name: Not on file  . Number of children: Not on file  . Years of education: Not on file  . Highest education level: Not on file  Occupational History  . Not on file  Social Needs  . Financial resource strain: Not on file  . Food insecurity:    Worry: Not on file    Inability: Not on file  . Transportation needs:    Medical: Not on file    Non-medical: Not on file  Tobacco Use  . Smoking status: Current Every Day Smoker    Packs/day: 0.25    Years: 5.00    Pack years: 1.25    Types: Cigarettes  . Smokeless tobacco: Never Used  Substance and Sexual Activity  . Alcohol use: Yes    Alcohol/week: 3.6 oz    Types: 6 Cans of beer per week    Comment: occassionally weekends   . Drug use: Yes    Types: Marijuana    Comment: weekends  . Sexual activity: Not Currently  Lifestyle  . Physical activity:    Days per week: Not on file    Minutes per session: Not on file  . Stress: Not on file  Relationships  . Social connections:    Talks on phone: Not on file    Gets together: Not on file    Attends religious service: Not on file    Active member of club  or organization: Not on file    Attends meetings of clubs or organizations: Not on file    Relationship status: Not on file  Other Topics Concern  . Not on file  Social History Narrative   ** Merged History Encounter **        Labs: Hepatitis C Lab Results  Component Value Date   HCVGENOTYPE 1 06/21/2017   HCVRNAPCRQN CANCELED 08/03/2017   HCVRNAPCRQN <15 DETECTED (A) 08/03/2017   HCVRNAPCRQN 1,020,000 (H) 06/21/2017   FIBROSTAGE F4 06/21/2017   Hepatitis B Lab Results  Component Value Date   HEPBSAB NON-REACTIVE 06/21/2017   HEPBSAG NON-REACTIVE 06/21/2017   Hepatitis A Lab Results  Component Value Date   HAV NON-REACTIVE 06/21/2017   HIV Lab Results  Component Value Date   HIV NON-REACTIVE 06/21/2017   Lab Results  Component Value Date    CREATININE 0.77 08/03/2017   CREATININE 0.81 05/28/2017   CREATININE 1.20 06/16/2016   Lab Results  Component Value Date   AST 44 (H) 08/03/2017   AST 94 (H) 05/28/2017   ALT 45 08/03/2017   ALT 170 (H) 06/21/2017   ALT 108 (H) 05/28/2017   INR 1.1 06/21/2017    Fibrosis Score: F2/F3 as assessed by [facility]   Assessment: Juan Malone is here today to follow-up for his Hep C infection.  He was initially approved for 8 weeks of Mavyret and started around 4/22.  I saw him in mid- May and he had missed a full week of medication due to being out of town and forgetting to bring the [place].  He took it for a few more weeks and then went to court on June 4th where he was sentenced to spending 30 days in jail. Last dose of Mavyret was June 3rd. His girlfriend was supposed to bring him the box of Mavyret to the jail, but she never did. He just got out last week and called me asking if he should continue taking or not.  I told him to stop and come in for labs.  He tells me that he finished one whole box of Mavyret which is a month's worth. This was sporadic and not consistent. He has an unopened box at home.  I highly, highly doubt that the 4 weeks of Mavyret was sufficient.  His Hep C RNA was undetectable when checked back in May, but I suspect it has reactivated. I will check a Hep C RNA, CBC, and CMET today, and add NS5A resistance as well.  His re-treatment will be extremely complicated since he will have failed a NS3/4A protease inhibitor/NS5A inhibitor regimen.  We will most likely have to try Mavyret + Sovaldi + Ribavirin, which I am uncertain he will maintain and take consistently considering it will be about 10 pills he would have to take multiple times per day (7 in AM and 3 in PM).  It may be difficult to get Medicaid to pay for the regimen as well.  Once I get his labs back, I will send him back to see Judeth CornfieldStephanie for discussion of re-treatment.  I will also follow back up with the patient as  well.   Plan: - Hep C RNA, NS5A resistance assay, CMET, CBC today - Will f/u with results  Preslei Blakley L. Julliette Frentz, PharmD, AAHIVP, CPP Infectious Diseases Clinical Pharmacist [facility] 09/30/2017, 2:04 PM

## 2017-09-30 NOTE — Telephone Encounter (Signed)
Yes it will be. I am not very optimistic.Marland Kitchen..Marland Kitchen

## 2017-10-01 DIAGNOSIS — M25561 Pain in right knee: Secondary | ICD-10-CM | POA: Diagnosis not present

## 2017-10-03 LAB — HEPATITIS C RNA QUANTITATIVE
HCV QUANT LOG: NOT DETECTED {Log_IU}/mL
HCV RNA, PCR, QN: <15 IU/mL

## 2017-10-04 ENCOUNTER — Ambulatory Visit (INDEPENDENT_AMBULATORY_CARE_PROVIDER_SITE_OTHER): Payer: Medicaid Other | Admitting: Family Medicine

## 2017-10-04 ENCOUNTER — Encounter: Payer: Self-pay | Admitting: Family Medicine

## 2017-10-04 VITALS — BP 129/89 | HR 52 | Temp 98.3°F | Resp 14 | Ht 70.0 in | Wt 147.0 lb

## 2017-10-04 DIAGNOSIS — Z716 Tobacco abuse counseling: Secondary | ICD-10-CM | POA: Diagnosis not present

## 2017-10-04 DIAGNOSIS — M5430 Sciatica, unspecified side: Secondary | ICD-10-CM

## 2017-10-04 DIAGNOSIS — J4520 Mild intermittent asthma, uncomplicated: Secondary | ICD-10-CM | POA: Diagnosis not present

## 2017-10-04 DIAGNOSIS — F17200 Nicotine dependence, unspecified, uncomplicated: Secondary | ICD-10-CM | POA: Diagnosis not present

## 2017-10-04 DIAGNOSIS — M549 Dorsalgia, unspecified: Secondary | ICD-10-CM

## 2017-10-04 DIAGNOSIS — B182 Chronic viral hepatitis C: Secondary | ICD-10-CM

## 2017-10-04 DIAGNOSIS — I10 Essential (primary) hypertension: Secondary | ICD-10-CM | POA: Diagnosis not present

## 2017-10-04 LAB — POCT URINALYSIS DIPSTICK
Bilirubin, UA: NEGATIVE
Blood, UA: NEGATIVE
Glucose, UA: NEGATIVE
Ketones, UA: NEGATIVE
Nitrite, UA: NEGATIVE
Protein, UA: NEGATIVE
Spec Grav, UA: 1.02 (ref 1.010–1.025)
Urobilinogen, UA: 0.2 E.U./dL
pH, UA: 7 (ref 5.0–8.0)

## 2017-10-04 LAB — POCT GLYCOSYLATED HEMOGLOBIN (HGB A1C): Hemoglobin A1C: 8.9 % — AB (ref 4.0–5.6)

## 2017-10-04 MED ORDER — AMLODIPINE BESYLATE 5 MG PO TABS: 5.0000 mg | ORAL_TABLET | Freq: Every day | ORAL | 1 refills | Status: DC

## 2017-10-04 NOTE — Progress Notes (Signed)
    Subjective   Juan Malone 57 y.o. male  [id number]  [id number]  07-11-1960    Chief Complaint  Patient presents with  . Hypertension    Patient presents for follow up on HTN and asthma. Denies problems or concerns today. States that he is taking amlodipine daily as prescribed. Patient states that he is not following a low sodium diet. Does not have an exercise regimen. Patient states that he continues to smoke. One pack will last a week. Patient states that he has seasonal allergies but does not want to take medication for this.    Review of Systems  Constitutional: Negative.   HENT: Positive for tinnitus (noted after taking diclofenac). Negative for ear pain.   Eyes: Negative.   Respiratory: Negative.   Cardiovascular: Negative.   Neurological: Negative.   Psychiatric/Behavioral: Negative.     Objective   Physical Exam  Constitutional: He is oriented to person, place, and time. He appears well-developed and well-nourished.  HENT:  Head: Normocephalic and atraumatic.  Right Ear: External ear normal.  Left Ear: External ear normal.  Nose: Nose normal.  Mouth/Throat: Oropharynx is clear and moist.  Eyes: Pupils are equal, round, and reactive to light. Conjunctivae and EOM are normal.  Neck: Normal range of motion. Neck supple. No tracheal deviation present. No thyromegaly present.  Cardiovascular: Normal rate, regular rhythm, normal heart sounds and intact distal pulses. Exam reveals no friction rub.  No murmur heard. Pulmonary/Chest: Effort normal and breath sounds normal. No stridor. No respiratory distress. He has no wheezes.  Abdominal: Soft. Bowel sounds are normal. He exhibits no distension and no mass. There is no tenderness.  Musculoskeletal: Normal range of motion.  Lymphadenopathy:    He has no cervical adenopathy.  Neurological: He is alert and oriented to person, place, and time.  Skin: Skin is warm and dry.  Psychiatric: He has a normal mood and  affect. His behavior is normal. Judgment and thought content normal.  Nursing note and vitals reviewed.   BP 129/89 (BP Location: Left Arm, Patient Position: Sitting, Cuff Size: Normal)   Pulse (!) 52   Temp 98.3 F (36.8 C) (Oral)   Resp 14   Ht 5\' 10"  (1.778 m)   Wt 147 lb (66.7 kg)   SpO2 100%   BMI 21.09 kg/m   Assessment   Encounter Diagnoses  Name Primary?  . Essential hypertension Yes  . Mild intermittent asthma without complication   . Chronic hepatitis C without hepatic coma (HCC)      Plan  1. Essential hypertension - Urinalysis Dipstick - Lipid panel - Comprehensive metabolic panel - amLODipine (NORVASC) 5 MG tablet; Take 1 tablet (5 mg total) by mouth daily.  Dispense: 90 tablet; Refill: 1 - Hemoglobin A1c  2. Mild intermittent asthma without complication Continue with current medications Offered allergy medication. Patient declined.   3. Chronic hepatitis C without hepatic coma (HCC) Continue with current medications   4. Back pain with sciatica Continue with current medications   5. Tobacco dependence Patient is not interested in quitting at the present time.      This note has been created with Education officer, environmentalDragon speech recognition software and smart phrase technology. Any transcriptional errors are unintentional.

## 2017-10-04 NOTE — Patient Instructions (Signed)
Hypertension Hypertension is another name for high blood pressure. High blood pressure forces your heart to work harder to pump blood. This can cause problems over time. There are two numbers in a blood pressure reading. There is a top number (systolic) over a bottom number (diastolic). It is best to have a blood pressure below 120/80. Healthy choices can help lower your blood pressure. You may need medicine to help lower your blood pressure if:  Your blood pressure cannot be lowered with healthy choices.  Your blood pressure is higher than 130/80.  Follow these instructions at home: Eating and drinking  If directed, follow the DASH eating plan. This diet includes: ? Filling half of your plate at each meal with fruits and vegetables. ? Filling one quarter of your plate at each meal with whole grains. Whole grains include whole wheat pasta, brown rice, and whole grain bread. ? Eating or drinking low-fat dairy products, such as skim milk or low-fat yogurt. ? Filling one quarter of your plate at each meal with low-fat (lean) proteins. Low-fat proteins include fish, skinless chicken, eggs, beans, and tofu. ? Avoiding fatty meat, cured and processed meat, or chicken with skin. ? Avoiding premade or processed food.  Eat less than 1,500 mg of salt (sodium) a day.  Limit alcohol use to no more than 1 drink a day for nonpregnant women and 2 drinks a day for men. One drink equals 12 oz of beer, 5 oz of wine, or 1 oz of hard liquor. Lifestyle  Work with your doctor to stay at a healthy weight or to lose weight. Ask your doctor what the best weight is for you.  Get at least 30 minutes of exercise that causes your heart to beat faster (aerobic exercise) most days of the week. This may include walking, swimming, or biking.  Get at least 30 minutes of exercise that strengthens your muscles (resistance exercise) at least 3 days a week. This may include lifting weights or pilates.  Do not use any  products that contain nicotine or tobacco. This includes cigarettes and e-cigarettes. If you need help quitting, ask your doctor.  Check your blood pressure at home as told by your doctor.  Keep all follow-up visits as told by your doctor. This is important. Medicines  Take over-the-counter and prescription medicines only as told by your doctor. Follow directions carefully.  Do not skip doses of blood pressure medicine. The medicine does not work as well if you skip doses. Skipping doses also puts you at risk for problems.  Ask your doctor about side effects or reactions to medicines that you should watch for. Contact a doctor if:  You think you are having a reaction to the medicine you are taking.  You have headaches that keep coming back (recurring).  You feel dizzy.  You have swelling in your ankles.  You have trouble with your vision. Get help right away if:  You get a very bad headache.  You start to feel confused.  You feel weak or numb.  You feel faint.  You get very bad pain in your: ? Chest. ? Belly (abdomen).  You throw up (vomit) more than once.  You have trouble breathing. Summary  Hypertension is another name for high blood pressure.  Making healthy choices can help lower blood pressure. If your blood pressure cannot be controlled with healthy choices, you may need to take medicine. This information is not intended to replace advice given to you by your health care   care provider. Make sure you discuss any questions you have with your health care provider. Document Released: 08/26/2007 Document Revised: 02/05/2016 Document Reviewed: 02/05/2016 Elsevier Interactive Patient Education  2018 [employer]   Managing Your Hypertension Hypertension is commonly called high blood pressure. This is when the force of your blood pressing against the walls of your arteries is too strong. Arteries are blood vessels that carry blood from your heart throughout your body.  Hypertension forces the heart to work harder to pump blood, and may cause the arteries to become narrow or stiff. Having untreated or uncontrolled hypertension can cause heart attack, stroke, kidney disease, and other problems. What are blood pressure readings? A blood pressure reading consists of a higher number over a lower number. Ideally, your blood pressure should be below 120/80. The first ("top") number is called the systolic pressure. It is a measure of the pressure in your arteries as your heart beats. The second ("bottom") number is called the diastolic pressure. It is a measure of the pressure in your arteries as the heart relaxes. What does my blood pressure reading mean? Blood pressure is classified into four stages. Based on your blood pressure reading, your health care provider may use the following stages to determine what type of treatment you need, if any. Systolic pressure and diastolic pressure are measured in a unit called mm Hg. Normal  Systolic pressure: below 120.  Diastolic pressure: below 80. Elevated  Systolic pressure: 120-129.  Diastolic pressure: below 80. Hypertension stage 1  Systolic pressure: 130-139.  Diastolic pressure: 80-89. Hypertension stage 2  Systolic pressure: 140 or above.  Diastolic pressure: 90 or above. What health risks are associated with hypertension? Managing your hypertension is an important responsibility. Uncontrolled hypertension can lead to:  A heart attack.  A stroke.  A weakened blood vessel (aneurysm).  Heart failure.  Kidney damage.  Eye damage.  Metabolic syndrome.  Memory and concentration problems.  What changes can I make to manage my hypertension? Hypertension can be managed by making lifestyle changes and possibly by taking medicines. Your health care provider will help you make a plan to bring your blood pressure within a normal range. Eating and drinking  Eat a diet that is high in fiber and potassium,  and low in salt (sodium), added sugar, and fat. An example eating plan is called the DASH (Dietary Approaches to Stop Hypertension) diet. To eat this way: ? Eat plenty of fresh fruits and vegetables. Try to fill half of your plate at each meal with fruits and vegetables. ? Eat whole grains, such as whole wheat pasta, brown rice, or whole grain bread. Fill about one quarter of your plate with whole grains. ? Eat low-fat diary products. ? Avoid fatty cuts of meat, processed or cured meats, and poultry with skin. Fill about one quarter of your plate with lean proteins such as fish, chicken without skin, beans, eggs, and tofu. ? Avoid premade and processed foods. These tend to be higher in sodium, added sugar, and fat.  Reduce your daily sodium intake. Most people with hypertension should eat less than 1,500 mg of sodium a day.  Limit alcohol intake to no more than 1 drink a day for nonpregnant women and 2 drinks a day for men. One drink equals 12 oz of beer, 5 oz of wine, or 1 oz of hard liquor. Lifestyle  Work with your health care provider to maintain a healthy body weight, or to lose weight. Ask what an ideal weight  you.  Get at least 30 minutes of exercise that causes your heart to beat faster (aerobic exercise) most days of the week. Activities may include walking, swimming, or biking.  Include exercise to strengthen your muscles (resistance exercise), such as weight lifting, as part of your weekly exercise routine. Try to do these types of exercises for 30 minutes at least 3 days a week.  Do not use any products that contain nicotine or tobacco, such as cigarettes and e-cigarettes. If you need help quitting, ask your health care provider.  Control any long-term (chronic) conditions you have, such as high cholesterol or diabetes. Monitoring  Monitor your blood pressure at home as told by your health care provider. Your personal target blood pressure may vary depending on your medical  conditions, your age, and other factors.  Have your blood pressure checked regularly, as often as told by your health care provider. Working with your health care provider  Review all the medicines you take with your health care provider because there may be side effects or interactions.  Talk with your health care provider about your diet, exercise habits, and other lifestyle factors that may be contributing to hypertension.  Visit your health care provider regularly. Your health care provider can help you create and adjust your plan for managing hypertension. Will I need medicine to control my blood pressure? Your health care provider may prescribe medicine if lifestyle changes are not enough to get your blood pressure under control, and if:  Your systolic blood pressure is 130 or higher.  Your diastolic blood pressure is 80 or higher.  Take medicines only as told by your health care provider. Follow the directions carefully. Blood pressure medicines must be taken as prescribed. The medicine does not work as well when you skip doses. Skipping doses also puts you at risk for problems. Contact a health care provider if:  You think you are having a reaction to medicines you have taken.  You have repeated (recurrent) headaches.  You feel dizzy.  You have swelling in your ankles.  You have trouble with your vision. Get help right away if:  You develop a severe headache or confusion.  You have unusual weakness or numbness, or you feel faint.  You have severe pain in your chest or abdomen.  You vomit repeatedly.  You have trouble breathing. Summary  Hypertension is when the force of blood pumping through your arteries is too strong. If this condition is not controlled, it may put you at risk for serious complications.  Your personal target blood pressure may vary depending on your medical conditions, your age, and other factors. For most people, a normal blood pressure is less  than 120/80.  Hypertension is managed by lifestyle changes, medicines, or both. Lifestyle changes include weight loss, eating a healthy, low-sodium diet, exercising more, and limiting alcohol. This information is not intended to replace advice given to you by your health care provider. Make sure you discuss any questions you have with your health care provider. Document Released: 12/02/2011 Document Revised: 02/05/2016 Document Reviewed: 02/05/2016 Elsevier Interactive Patient Education  2018 Elsevier Inc.  

## 2017-10-05 LAB — COMPREHENSIVE METABOLIC PANEL
ALT: 12 IU/L (ref 0–44)
AST: 19 IU/L (ref 0–40)
Albumin/Globulin Ratio: 1.1 — ABNORMAL LOW (ref 1.2–2.2)
Albumin: 4 g/dL (ref 3.5–5.5)
Alkaline Phosphatase: 87 IU/L (ref 39–117)
BUN/Creatinine Ratio: 14 (ref 9–20)
BUN: 11 mg/dL (ref 6–24)
Bilirubin Total: 0.2 mg/dL (ref 0.0–1.2)
CO2: 27 mmol/L (ref 20–29)
Calcium: 9.6 mg/dL (ref 8.7–10.2)
Chloride: 98 mmol/L (ref 96–106)
Creatinine, Ser: 0.8 mg/dL (ref 0.76–1.27)
GFR calc Af Amer: 116 mL/min/{1.73_m2} (ref >59)
GFR calc non Af Amer: 101 mL/min/{1.73_m2} (ref >59)
Globulin, Total: 3.8 g/dL (ref 1.5–4.5)
Glucose: 90 mg/dL (ref 65–99)
Potassium: 4.6 mmol/L (ref 3.5–5.2)
Sodium: 138 mmol/L (ref 134–144)
Total Protein: 7.8 g/dL (ref 6.0–8.5)

## 2017-10-05 LAB — LIPID PANEL
Chol/HDL Ratio: 2.3 ratio (ref 0.0–5.0)
Cholesterol, Total: 200 mg/dL — ABNORMAL HIGH (ref 100–199)
HDL: 86 mg/dL (ref >39)
LDL Calculated: 100 mg/dL — ABNORMAL HIGH (ref 0–99)
Triglycerides: 71 mg/dL (ref 0–149)
VLDL Cholesterol Cal: 14 mg/dL (ref 5–40)

## 2017-10-05 LAB — HCV RNA NS5A DRUG RESISTANCE: HCV NS5A Subtype: NOT DETECTED

## 2017-10-06 ENCOUNTER — Telehealth: Payer: Self-pay

## 2017-10-06 ENCOUNTER — Other Ambulatory Visit: Payer: Self-pay | Admitting: Family Medicine

## 2017-10-06 ENCOUNTER — Telehealth: Payer: Self-pay | Admitting: Pharmacist

## 2017-10-06 MED ORDER — MELOXICAM 15 MG PO TABS: 15.0000 mg | ORAL_TABLET | Freq: Every day | ORAL | 0 refills | Status: DC

## 2017-10-06 NOTE — Telephone Encounter (Signed)
Spoke with patient, he is asking if you are going to start back his diclofenac. Please advise. Thanks!

## 2017-10-06 NOTE — Telephone Encounter (Signed)
-----   Message from Mike GipAndre Douglas, FNP sent at 10/06/2017 12:21 PM EDT ----- There is a mild increase in your cholesterol since the last lab. Continue with current medications. Walking 30 minutes per day 5 days a week. Heart healthy diet to include low carbs, vegetables and water.  Remember to keep your follow up appointments as scheduled.

## 2017-10-06 NOTE — Telephone Encounter (Signed)
Patient's Hep C viral load is still undetectable even though he only took 4 weeks of Mavyret sproadically. Discussed with Judeth CornfieldStephanie and Dr. Luciana Axeomer - will have patient take additional 4 weeks for a total of 8 weeks of therapy and see him back next month when he is done. [id number]

## 2017-10-06 NOTE — Progress Notes (Signed)
Patient requesting NSAID.

## 2017-10-06 NOTE — Telephone Encounter (Signed)
Patient stated that the diclofenac gave him tinnitus. I can send mobic in for him instead.

## 2017-10-06 NOTE — Telephone Encounter (Signed)
Spoke with patient, advised that cholesterol was mildly elevated and that he should continue current medications. Advised to eat a low carb diet, including vegetables and water. Asked to work on walking 30 minutes a day for 5 days a week. Asked that he keep he scheduled follow up appointment. Thanks!

## 2017-11-01 ENCOUNTER — Telehealth: Payer: Self-pay

## 2017-11-01 DIAGNOSIS — I10 Essential (primary) hypertension: Secondary | ICD-10-CM

## 2017-11-01 MED ORDER — AMLODIPINE BESYLATE 5 MG PO TABS: 5.0000 mg | ORAL_TABLET | Freq: Every day | ORAL | 1 refills | Status: DC

## 2017-11-01 MED ORDER — MELOXICAM 15 MG PO TABS: 15.0000 mg | ORAL_TABLET | Freq: Every day | ORAL | 0 refills | Status: DC

## 2017-11-01 NOTE — Telephone Encounter (Signed)
Refill for amlodipine and mobic sent into pharmacy. Thanks!

## 2017-11-18 ENCOUNTER — Ambulatory Visit (INDEPENDENT_AMBULATORY_CARE_PROVIDER_SITE_OTHER): Payer: Medicaid Other | Admitting: Pharmacist

## 2017-11-18 DIAGNOSIS — B182 Chronic viral hepatitis C: Secondary | ICD-10-CM

## 2017-11-18 NOTE — Progress Notes (Signed)
HPI: Juan Malone is a 57 y.o. male who presents to the [facility] pharmacy clinic for Hep C follow-up.  Medication: Mavyret x 8 weeks  Start Date: 07/12/17  Hepatitis C Genotype: 1  Fibrosis Score: F2/F3  Hepatitis C RNA: 1.2 million on 06/21/17, <15 on 08/03/17 and 09/30/17  Patient Active Problem List   Diagnosis Date Noted  . Hepatitis C virus infection without hepatic coma 06/10/2017  . Essential hypertension 06/02/2017  . Mild intermittent asthma without complication 06/02/2017  . Fracture of patella with routine healing, right, closed 02/19/2017    Patient's Medications  New Prescriptions   No medications on file  Previous Medications   ALBUTEROL (PROVENTIL HFA;VENTOLIN HFA) 108 (90 BASE) MCG/ACT INHALER    Inhale 2 puffs into the lungs every 4 (four) hours as needed for wheezing or shortness of breath.   AMLODIPINE (NORVASC) 5 MG TABLET    Take 1 tablet (5 mg total) by mouth daily.   GABAPENTIN (NEURONTIN) 300 MG CAPSULE    Take 1 capsule (300 mg total) by mouth at bedtime.   MELOXICAM (MOBIC) 15 MG TABLET    Take 1 tablet (15 mg total) by mouth daily.  Modified Medications   No medications on file  Discontinued Medications   GLECAPREVIR-PIBRENTASVIR (MAVYRET) 100-40 MG TABS    Take 3 tablets by mouth daily.    Allergies: Allergies  Allergen Reactions  . Penicillins Anaphylaxis    Has patient had a PCN reaction causing immediate rash, facial/tongue/throat swelling, SOB or lightheadedness with hypotension: yes Has patient had a PCN reaction causing severe rash involving mucus membranes or skin necrosis: no Has patient had a PCN reaction that required hospitalization: yes Has patient had a PCN reaction occurring within the last 10 years: no If all of the above answers are "NO", then may proceed with Cephalosporin use.     Past Medical History: Past Medical History:  Diagnosis Date  . Asthma   . Essential hypertension 06/02/2017  . Hepatitis C virus infection  without hepatic coma 06/10/2017  . PONV (postoperative nausea and vomiting)     Social History: Social History   Socioeconomic History  . Marital status: Single    Spouse name: Not on file  . Number of children: Not on file  . Years of education: Not on file  . Highest education level: Not on file  Occupational History  . Not on file  Social Needs  . Financial resource strain: Not on file  . Food insecurity:    Worry: Not on file    Inability: Not on file  . Transportation needs:    Medical: Not on file    Non-medical: Not on file  Tobacco Use  . Smoking status: Current Every Day Smoker    Packs/day: 0.25    Years: 5.00    Pack years: 1.25    Types: Cigarettes  . Smokeless tobacco: Never Used  Substance and Sexual Activity  . Alcohol use: Yes    Alcohol/week: 6.0 standard drinks    Types: 6 Cans of beer per week    Comment: occassionally weekends   . Drug use: Yes    Types: Marijuana    Comment: weekends  . Sexual activity: Not Currently  Lifestyle  . Physical activity:    Days per week: Not on file    Minutes per session: Not on file  . Stress: Not on file  Relationships  . Social connections:    Talks on phone: Not on file  Gets together: Not on file    Attends religious service: Not on file    Active member of club or organization: Not on file    Attends meetings of clubs or organizations: Not on file    Relationship status: Not on file  Other Topics Concern  . Not on file  Social History Narrative   ** Merged History Encounter **        Labs: Hepatitis C Lab Results  Component Value Date   HCVGENOTYPE 1 06/21/2017   HCVRNAPCRQN <15 NOT DETECTED 09/30/2017   HCVRNAPCRQN CANCELED 08/03/2017   HCVRNAPCRQN <15 DETECTED (A) 08/03/2017   HCVRNAPCRQN 1,020,000 (H) 06/21/2017   FIBROSTAGE F4 06/21/2017   Hepatitis B Lab Results  Component Value Date   HEPBSAB NON-REACTIVE 06/21/2017   HEPBSAG NON-REACTIVE 06/21/2017   Hepatitis A Lab Results    Component Value Date   HAV NON-REACTIVE 06/21/2017   HIV Lab Results  Component Value Date   HIV NON-REACTIVE 06/21/2017   Lab Results  Component Value Date   CREATININE 0.80 10/04/2017   CREATININE 0.68 (L) 09/30/2017   CREATININE 0.77 08/03/2017   CREATININE 0.81 05/28/2017   CREATININE 1.20 06/16/2016   Lab Results  Component Value Date   AST 19 10/04/2017   AST 18 09/30/2017   AST 44 (H) 08/03/2017   ALT 12 10/04/2017   ALT 11 09/30/2017   ALT 45 08/03/2017   INR 1.1 06/21/2017    Assessment: Juan Malone is here today to follow-up for his Hepatitis C infection. He was initially approved for 8 weeks of Mavyret and started on 4/22. He missed a full week of medication in early May due to being out of town.  He then took it for a few more weeks and was sentenced to 30 days in jail where he did not get any of the Mavyret at all. This situation was discussed with Judeth Cornfield and Dr. Luciana Axe and it was decided for him to continue and take the last month of Mavyret.  He comes in today after finishing the 2nd box. He states he missed absolutely no doses during this month and took all three pills together between 7-730pm each night with dinner. We will check another Hep C viral load today and count it as his EOT lab.  I will have him come back and see Judeth Cornfield in 3 months for his cure visit after getting labs.  I will call him next week with the lab results.   Plan: - Completed 8 weeks of Mavyret - Hep C viral load today - F/u for labs 12/2 at 10am - F/u with [facility] 12/10 at 930am  Amed Datta L. Karleigh Bunte, PharmD, AAHIVP, CPP Infectious Diseases Clinical Pharmacist [facility] 11/19/2017, 9:14 AM

## 2017-11-23 LAB — HEPATITIS C RNA QUANTITATIVE
HCV Quantitative Log: <1.18 Log IU/mL
HCV RNA, PCR, QN: NOT DETECTED [IU]/mL

## 2017-12-02 ENCOUNTER — Other Ambulatory Visit: Payer: Self-pay | Admitting: Family Medicine

## 2017-12-30 ENCOUNTER — Other Ambulatory Visit: Payer: Self-pay | Admitting: Family Medicine

## 2017-12-30 DIAGNOSIS — M5430 Sciatica, unspecified side: Secondary | ICD-10-CM

## 2017-12-30 DIAGNOSIS — M549 Dorsalgia, unspecified: Principal | ICD-10-CM

## 2017-12-30 DIAGNOSIS — J4520 Mild intermittent asthma, uncomplicated: Secondary | ICD-10-CM

## 2018-01-05 ENCOUNTER — Ambulatory Visit: Payer: Medicaid Other | Admitting: Family Medicine

## 2018-01-07 ENCOUNTER — Ambulatory Visit: Payer: Medicaid Other | Admitting: Family Medicine

## 2018-01-10 ENCOUNTER — Other Ambulatory Visit: Payer: Self-pay | Admitting: Family Medicine

## 2018-01-11 ENCOUNTER — Telehealth: Payer: Self-pay

## 2018-01-11 NOTE — Telephone Encounter (Signed)
Called to verify appointment for 01/12/2018. No answer and no voicemail. Thanks!

## 2018-01-12 ENCOUNTER — Encounter: Payer: Self-pay | Admitting: Family Medicine

## 2018-01-12 ENCOUNTER — Ambulatory Visit (HOSPITAL_COMMUNITY)
Admission: RE | Admit: 2018-01-12 | Discharge: 2018-01-12 | Disposition: A | Discharge status: Home / Self Care | Payer: Medicaid Other | Source: Ambulatory Visit | Attending: Family Medicine | Admitting: Family Medicine

## 2018-01-12 ENCOUNTER — Ambulatory Visit (INDEPENDENT_AMBULATORY_CARE_PROVIDER_SITE_OTHER): Payer: Medicaid Other | Admitting: Family Medicine

## 2018-01-12 VITALS — BP 135/84 | HR 67 | Temp 98.7°F | Resp 16 | Ht 70.0 in | Wt 148.0 lb

## 2018-01-12 DIAGNOSIS — I10 Essential (primary) hypertension: Secondary | ICD-10-CM

## 2018-01-12 DIAGNOSIS — R7309 Other abnormal glucose: Secondary | ICD-10-CM

## 2018-01-12 DIAGNOSIS — S2231XA Fracture of one rib, right side, initial encounter for closed fracture: Secondary | ICD-10-CM | POA: Diagnosis not present

## 2018-01-12 DIAGNOSIS — M545 Low back pain, unspecified: Secondary | ICD-10-CM

## 2018-01-12 DIAGNOSIS — M5137 Other intervertebral disc degeneration, lumbosacral region: Secondary | ICD-10-CM | POA: Diagnosis not present

## 2018-01-12 DIAGNOSIS — Z1211 Encounter for screening for malignant neoplasm of colon: Secondary | ICD-10-CM | POA: Diagnosis not present

## 2018-01-12 DIAGNOSIS — X58XXXA Exposure to other specified factors, initial encounter: Secondary | ICD-10-CM | POA: Diagnosis not present

## 2018-01-12 LAB — POCT GLYCOSYLATED HEMOGLOBIN (HGB A1C): Hemoglobin A1C: 5.2 % (ref 4.0–5.6)

## 2018-01-12 LAB — POCT URINALYSIS DIPSTICK
Blood, UA: NEGATIVE
Glucose, UA: NEGATIVE
Leukocytes, UA: NEGATIVE
Nitrite, UA: NEGATIVE
Protein, UA: POSITIVE — AB
Spec Grav, UA: 1.025 (ref 1.010–1.025)
Urobilinogen, UA: 0.2 E.U./dL
pH, UA: 5.5 (ref 5.0–8.0)

## 2018-01-12 MED ORDER — GABAPENTIN 300 MG PO CAPS: 300.0000 mg | ORAL_CAPSULE | Freq: Three times a day (TID) | ORAL | 3 refills | Status: DC

## 2018-01-12 MED ORDER — MELOXICAM 15 MG PO TABS: 15.0000 mg | ORAL_TABLET | Freq: Every day | ORAL | 1 refills | Status: DC

## 2018-01-12 NOTE — Patient Instructions (Signed)

## 2018-01-12 NOTE — Progress Notes (Signed)
[facility]  Provider: Mike Gip, FNP   Hypertension Follow Up Visit  SUBJECTIVE:  Juan Malone is a 57 y.o. male who  has a past medical history of Asthma, Essential hypertension (06/02/2017), Hepatitis C virus infection without hepatic coma (06/10/2017), and PONV (postoperative nausea and vomiting).  Patient states that he is not diabetic. However, his last A1C was 8.9. He is not on any medications for this at the present time.  He is concerned about back pain that is on the right side. He describes the pain as "pinching". Occurs with positional changes. Patient states that he has leg twitching noted on the right side that wakes him up at night. Denies bowel or bladder incontinence. Denies numbness or tingling of legs or feet.      Current Outpatient Medications  Medication Sig Dispense Refill  . amLODipine (NORVASC) 5 MG tablet Take 1 tablet (5 mg total) by mouth daily. 90 tablet 1  . gabapentin (NEURONTIN) 300 MG capsule TAKE ONE CAPSULE AT BEDTIME 90 capsule 3  . meloxicam (MOBIC) 15 MG tablet Take 1 tablet (15 mg total) by mouth daily. 30 tablet 0  . PROAIR HFA 108 (90 Base) MCG/ACT inhaler Inhale 2 puffs into the lungs every 4 (four) hours as needed for wheezing or shortness of breath. 8.5 g 3   No current facility-administered medications for this visit.     Recent Results (from the past 2160 hour(s))  Hepatitis C RNA quantitative     Status: None   Collection Time: 11/18/17  9:38 AM  Result Value Ref Range   HCV RNA, PCR, QN <15 NOT DETECTED NOT DETECT IU/mL   HCV Quantitative Log <1.18 NOT DETECTED NOT DETECT Log IU/mL    Comment: This test was performed using Real-Time Polymerase Chain Reaction. . Reportable Range: 15 IU/mL to 100,000,000 IU/mL (1.18 Log IU/mL to 8.00 Log IU/mL). . The analytical performance characteristics of this assay have been determined by [facility].  The modifications have  not been cleared or approved by the FDA. This assay has been validated pursuant to the  CLIA regulations and is used for clinical purposes.   . For more information on this test, go to: http://education.questdiagnostics.com/faq/FAQ22v1 (This link is being provided for informational/ educational purposes only.) .     Hypertension ROS: Review of Systems  Constitutional: Negative.   HENT: Negative.   Eyes: Negative.   Respiratory: Negative.   Cardiovascular: Negative.   Gastrointestinal: Negative.   Genitourinary: Positive for frequency.  Musculoskeletal: Positive for back pain.  Skin: Negative.   Neurological: Negative.  Negative for tingling and weakness.  Psychiatric/Behavioral: Negative.      OBJECTIVE:   BP 135/84 (BP Location: Left Arm, Patient Position: Sitting, Cuff Size: Normal)   Pulse 67   Temp 98.7 F (37.1 C) (Oral)   Resp 16   Ht 5\' 10"  (1.778 m)   Wt 148 lb (67.1 kg)   SpO2 100%   BMI 21.24 kg/m   Physical Exam  Constitutional: He is oriented to person, place, and time and well-developed, well-nourished, and in no distress. No distress.  HENT:  Head: Normocephalic and atraumatic.  Eyes: Pupils are equal, round, and reactive to light. Conjunctivae and EOM are normal.  Neck: Normal range of motion. Neck supple.  Cardiovascular: Normal rate, regular rhythm and intact distal pulses. Exam reveals no gallop and no friction rub.  No murmur heard. Pulmonary/Chest: Effort normal and breath sounds normal. No respiratory  distress. He has no wheezes.  Abdominal: Soft. Bowel sounds are normal. There is no tenderness.  Musculoskeletal: Normal range of motion. He exhibits tenderness (Right lumbar). He exhibits no edema.  Lymphadenopathy:    He has no cervical adenopathy.  Neurological: He is alert and oriented to person, place, and time. Gait normal.  Skin: Skin is warm and dry.  Psychiatric: Mood, memory, affect and judgment normal.  Nursing note and vitals  reviewed.    ASSESSMENT/PLAN:   1. Essential hypertension No medication changes warranted at the present time. - Urinalysis Dipstick  2. Acute right-sided low back pain without sciatica We increased the gabapentin to help with your back pain. Imaging ordered.  - DG Lumbar Spine Complete; Future - gabapentin (NEURONTIN) 300 MG capsule; Take 1 capsule (300 mg total) by mouth 3 (three) times daily.  Dispense: 90 capsule; Refill: 3 - meloxicam (MOBIC) 15 MG tablet; Take 1 tablet (15 mg total) by mouth daily.  Dispense: 30 tablet; Refill: 1  3. Colon cancer screening Order cologard.  - Cologuard  4. Abnormal glucose A1c is 5.2 today. Possible error in entry at last visit.  - HgB A1c    The patient is asked to make an attempt to improve diet and exercise patterns to aid in medical management of this problem.  Return to care as scheduled and prn. Patient verbalized understanding and agreed with plan of care.    Ms. Freda Jackson. Riley Lam, FNP-BC [facility] [address] [place], [place] [address] [phone]

## 2018-01-14 ENCOUNTER — Telehealth: Payer: Self-pay

## 2018-01-14 NOTE — Telephone Encounter (Signed)
-----   Message from Mike Gip, FNP sent at 01/13/2018 10:50 PM EDT ----- Please inform patient that his x-ray showed an old fracture on the right side the 11th rib.  This is the likely cause of the pain that you have been experiencing. The  treatment includes taking a break from strenuous activities to avoid hurting herself again.  You can also put ice on the area to relieve the pain.  You can take an over-the-counter analgesic to help with the pain.  I want you to remember to take deep breaths as to avoid pneumonia.  Do not wrap anything tightly around her ribs while they are healing you do not want anything to limit your breathing.  It can take about 6 to 7 weeks for your ribs to heal fully.

## 2018-01-14 NOTE — Telephone Encounter (Signed)
Called, no answer and no voicemail. Thanks!  

## 2018-01-14 NOTE — Telephone Encounter (Signed)
No call and no voicemail.

## 2018-01-17 NOTE — Telephone Encounter (Signed)
Will mail letter since I have been unable to reach patient by phone.

## 2018-01-24 DIAGNOSIS — Z1211 Encounter for screening for malignant neoplasm of colon: Secondary | ICD-10-CM | POA: Diagnosis not present

## 2018-01-27 ENCOUNTER — Other Ambulatory Visit: Payer: Self-pay | Admitting: Family Medicine

## 2018-01-27 DIAGNOSIS — I10 Essential (primary) hypertension: Secondary | ICD-10-CM

## 2018-02-21 ENCOUNTER — Other Ambulatory Visit: Payer: Medicaid Other

## 2018-03-01 ENCOUNTER — Ambulatory Visit: Payer: Medicaid Other | Admitting: Infectious Diseases

## 2018-03-01 DIAGNOSIS — M65312 Trigger thumb, left thumb: Secondary | ICD-10-CM | POA: Diagnosis not present

## 2018-03-01 DIAGNOSIS — M25511 Pain in right shoulder: Secondary | ICD-10-CM | POA: Diagnosis not present

## 2018-03-01 DIAGNOSIS — M25561 Pain in right knee: Secondary | ICD-10-CM | POA: Diagnosis not present

## 2018-03-08 ENCOUNTER — Other Ambulatory Visit: Payer: Self-pay | Admitting: Family Medicine

## 2018-03-08 DIAGNOSIS — M545 Low back pain, unspecified: Secondary | ICD-10-CM

## 2018-04-05 ENCOUNTER — Other Ambulatory Visit: Payer: Self-pay | Admitting: *Deleted

## 2018-04-05 ENCOUNTER — Encounter: Payer: Self-pay | Admitting: Neurology

## 2018-04-05 DIAGNOSIS — G5602 Carpal tunnel syndrome, left upper limb: Secondary | ICD-10-CM | POA: Diagnosis not present

## 2018-04-14 ENCOUNTER — Encounter: Payer: Self-pay | Admitting: Family Medicine

## 2018-04-14 ENCOUNTER — Ambulatory Visit (INDEPENDENT_AMBULATORY_CARE_PROVIDER_SITE_OTHER): Payer: Medicaid Other | Admitting: Family Medicine

## 2018-04-14 VITALS — BP 128/90 | HR 68 | Temp 98.4°F | Resp 16 | Ht 70.0 in | Wt 145.0 lb

## 2018-04-14 DIAGNOSIS — I10 Essential (primary) hypertension: Secondary | ICD-10-CM

## 2018-04-14 DIAGNOSIS — L209 Atopic dermatitis, unspecified: Secondary | ICD-10-CM | POA: Diagnosis not present

## 2018-04-14 DIAGNOSIS — M545 Low back pain, unspecified: Secondary | ICD-10-CM

## 2018-04-14 DIAGNOSIS — M549 Dorsalgia, unspecified: Secondary | ICD-10-CM

## 2018-04-14 DIAGNOSIS — G8929 Other chronic pain: Secondary | ICD-10-CM

## 2018-04-14 LAB — POCT URINALYSIS DIPSTICK
Bilirubin, UA: NEGATIVE
Blood, UA: NEGATIVE
Glucose, UA: NEGATIVE
Ketones, UA: NEGATIVE
Leukocytes, UA: NEGATIVE
Nitrite, UA: NEGATIVE
Protein, UA: POSITIVE — AB
Spec Grav, UA: 1.025 (ref 1.010–1.025)
Urobilinogen, UA: 1 E.U./dL
pH, UA: 6.5 (ref 5.0–8.0)

## 2018-04-14 MED ORDER — MELOXICAM 15 MG PO TABS: 15.0000 mg | ORAL_TABLET | Freq: Every day | ORAL | 2 refills | Status: DC

## 2018-04-14 MED ORDER — AMLODIPINE BESYLATE 5 MG PO TABS: 5.0000 mg | ORAL_TABLET | Freq: Every day | ORAL | 3 refills | Status: DC

## 2018-04-14 MED ORDER — TRIAMCINOLONE ACETONIDE 0.025 % EX OINT: 1.0000 "application " | TOPICAL_OINTMENT | Freq: Two times a day (BID) | CUTANEOUS | 0 refills | Status: AC

## 2018-04-14 NOTE — Progress Notes (Signed)
[facility]   Progress Note: General Provider: Mike GipAndre Gretta Samons, FNP  SUBJECTIVE:   Juan Malone is a 58 y.o. male who  has a past medical history of Asthma, Essential hypertension (06/02/2017), Hepatitis C virus infection without hepatic coma (06/10/2017), and PONV (postoperative nausea and vomiting).. Patient presents today for Hypertension; Back Pain (right left lower back ); and Knee Pain Patient followed by orthopedic for knee pain.  He continues to have knee pain and states that he would like narcotic medication for this. Patient also states that he is having a rash on his face that is consistent with his past history of eczema.  Has been using over-the-counter creams without relief. Patient states that he is doing well on his blood pressure medication.  Denies side effects of current medications.  Does not exercise on a regular basis.  Does not eat a heart healthy diet.  Review of Systems  Constitutional: Negative.   HENT: Negative.   Eyes: Negative.   Respiratory: Negative.   Cardiovascular: Negative.   Gastrointestinal: Negative.   Genitourinary: Negative.   Musculoskeletal: Positive for back pain and joint pain (right).  Skin: Positive for rash.  Neurological: Negative.   Psychiatric/Behavioral: Negative.      OBJECTIVE: BP 128/90 (BP Location: Left Arm, Patient Position: Sitting, Cuff Size: Normal) Comment: manually  Pulse 68   Temp 98.4 F (36.9 C) (Oral)   Resp 16   Ht 5\' 10"  (1.778 m)   Wt 145 lb (65.8 kg)   SpO2 99%   BMI 20.81 kg/m   Wt Readings from Last 3 Encounters:  04/14/18 145 lb (65.8 kg)  01/12/18 148 lb (67.1 kg)  10/04/17 147 lb (66.7 kg)     Physical Exam Vitals signs and nursing note reviewed.  Constitutional:      General: He is not in acute distress.    Appearance: He is well-developed.  HENT:     Head: Normocephalic and atraumatic.  Eyes:     Conjunctiva/sclera: Conjunctivae normal.       Pupils: Pupils are equal, round, and reactive to light.  Neck:     Musculoskeletal: Normal range of motion.  Cardiovascular:     Rate and Rhythm: Normal rate and regular rhythm.     Heart sounds: Normal heart sounds.  Pulmonary:     Effort: Pulmonary effort is normal. No respiratory distress.     Breath sounds: Normal breath sounds.  Abdominal:     General: Bowel sounds are normal. There is no distension.     Palpations: Abdomen is soft.  Musculoskeletal: Normal range of motion.        General: Tenderness (lumbar spine) present.  Skin:    General: Skin is warm and dry.     Comments: Dry skin with hyperpigmentation noted to the nasal folds and forehead.   Neurological:     Mental Status: He is alert and oriented to person, place, and time. Mental status is at baseline.  Psychiatric:        Behavior: Behavior normal.        Thought Content: Thought content normal.     ASSESSMENT/PLAN:   1. Essential hypertension The current medical regimen is effective;  continue present plan and medications.  - Urinalysis Dipstick - amLODipine (NORVASC) 5 MG tablet; Take 1 tablet (5 mg total) by mouth daily.  Dispense: 90 tablet; Refill: 3  2. Acute right-sided low back pain without sciatica Continue with current medications.  - meloxicam (  MOBIC) 15 MG tablet; Take 1 tablet (15 mg total) by mouth daily.  Dispense: 30 tablet; Refill: 2  3. Atopic dermatitis, unspecified type - triamcinolone (KENALOG) 0.025 % ointment; Apply 1 application topically 2 (two) times daily.  Dispense: 30 g; Refill: 0  4. Chronic bilateral back pain, unspecified back location Stretches advised. Discussed no narcotic medication will be prescribed for this.    Return in 3 months (on 07/14/2018).    The patient was given clear instructions to go to ER or return to medical center if symptoms do not improve, worsen or new problems develop. The patient verbalized understanding and agreed with plan of care.   Ms.  Freda Jackson. Riley Lam, FNP-BC [facility] [address] [place], [place] [address] [phone]

## 2018-04-14 NOTE — Patient Instructions (Addendum)
Hypertension Hypertension is another name for high blood pressure. High blood pressure forces your heart to work harder to pump blood. This can cause problems over time. There are two numbers in a blood pressure reading. There is a top number (systolic) over a bottom number (diastolic). It is best to have a blood pressure below 120/80. Healthy choices can help lower your blood pressure. You may need medicine to help lower your blood pressure if:  Your blood pressure cannot be lowered with healthy choices.  Your blood pressure is higher than 130/80. Follow these instructions at home: Eating and drinking   If directed, follow the DASH eating plan. This diet includes: ? Filling half of your plate at each meal with fruits and vegetables. ? Filling one quarter of your plate at each meal with whole grains. Whole grains include whole wheat pasta, brown rice, and whole grain bread. ? Eating or drinking low-fat dairy products, such as skim milk or low-fat yogurt. ? Filling one quarter of your plate at each meal with low-fat (lean) proteins. Low-fat proteins include fish, skinless chicken, eggs, beans, and tofu. ? Avoiding fatty meat, cured and processed meat, or chicken with skin. ? Avoiding premade or processed food.  Eat less than 1,500 mg of salt (sodium) a day.  Limit alcohol use to no more than 1 drink a day for nonpregnant women and 2 drinks a day for men. One drink equals 12 oz of beer, 5 oz of wine, or 1 oz of hard liquor. Lifestyle  Work with your doctor to stay at a healthy weight or to lose weight. Ask your doctor what the best weight is for you.  Get at least 30 minutes of exercise that causes your heart to beat faster (aerobic exercise) most days of the week. This may include walking, swimming, or biking.  Get at least 30 minutes of exercise that strengthens your muscles (resistance exercise) at least 3 days a week. This may include lifting weights or pilates.  Do not use any  products that contain nicotine or tobacco. This includes cigarettes and e-cigarettes. If you need help quitting, ask your doctor.  Check your blood pressure at home as told by your doctor.  Keep all follow-up visits as told by your doctor. This is important. Medicines  Take over-the-counter and prescription medicines only as told by your doctor. Follow directions carefully.  Do not skip doses of blood pressure medicine. The medicine does not work as well if you skip doses. Skipping doses also puts you at risk for problems.  Ask your doctor about side effects or reactions to medicines that you should watch for. Contact a doctor if:  You think you are having a reaction to the medicine you are taking.  You have headaches that keep coming back (recurring).  You feel dizzy.  You have swelling in your ankles.  You have trouble with your vision. Get help right away if:  You get a very bad headache.  You start to feel confused.  You feel weak or numb.  You feel faint.  You get very bad pain in your: ? Chest. ? Belly (abdomen).  You throw up (vomit) more than once.  You have trouble breathing. Summary  Hypertension is another name for high blood pressure.  Making healthy choices can help lower blood pressure. If your blood pressure cannot be controlled with healthy choices, you may need to take medicine. This information is not intended to replace advice given to you by your health care   provider. Make sure you discuss any questions you have with your health care provider. Document Released: 08/26/2007 Document Revised: 02/05/2016 Document Reviewed: 02/05/2016 Elsevier Interactive Patient Education  2019 [employer]   Acute Back Pain, Adult Acute back pain is sudden and usually short-lived. It is often caused by an injury to the muscles and tissues in the back. The injury may result from:  A muscle or ligament getting overstretched or torn (strained). Ligaments are  tissues that connect bones to each other. Lifting something improperly can cause a back strain.  Wear and tear (degeneration) of the spinal disks. Spinal disks are circular tissue that provides cushioning between the bones of the spine (vertebrae).  Twisting motions, such as while playing sports or doing yard work.  A hit to the back.  Arthritis. You may have a physical exam, lab tests, and imaging tests to find the cause of your pain. Acute back pain usually goes away with rest and home care. Follow these instructions at home: Managing pain, stiffness, and swelling  Take over-the-counter and prescription medicines only as told by your health care provider.  Your health care provider may recommend applying ice during the first 24-48 hours after your pain starts. To do this: ? Put ice in a plastic bag. ? Place a towel between your skin and the bag. ? Leave the ice on for 20 minutes, 2-3 times a day.  If directed, apply heat to the affected area as often as told by your health care provider. Use the heat source that your health care provider recommends, such as a moist heat pack or a heating pad. ? Place a towel between your skin and the heat source. ? Leave the heat on for 20-30 minutes. ? Remove the heat if your skin turns bright red. This is especially important if you are unable to feel pain, heat, or cold. You have a greater risk of getting burned. Activity   Do not stay in bed. Staying in bed for more than 1-2 days can delay your recovery.  Sit up and stand up straight. Avoid leaning forward when you sit, or hunching over when you stand. ? If you work at a desk, sit close to it so you do not need to lean over. Keep your chin tucked in. Keep your neck drawn back, and keep your elbows bent at a right angle. Your arms should look like the letter "L." ? Sit high and close to the steering wheel when you drive. Add lower back (lumbar) support to your car seat, if needed.  Take short  walks on even surfaces as soon as you are able. Try to increase the length of time you walk each day.  Do not sit, drive, or stand in one place for more than 30 minutes at a time. Sitting or standing for long periods of time can put stress on your back.  Do not drive or use heavy machinery while taking prescription pain medicine.  Use proper lifting techniques. When you bend and lift, use positions that put less stress on your back: ? Palmer your knees. ? Keep the load close to your body. ? Avoid twisting.  Exercise regularly as told by your health care provider. Exercising helps your back heal faster and helps prevent back injuries by keeping muscles strong and flexible.  Work with a physical therapist to make a safe exercise program, as recommended by your health care provider. Do any exercises as told by your physical therapist. Lifestyle  Maintain a  healthy weight. Extra weight puts stress on your back and makes it difficult to have good posture.  Avoid activities or situations that make you feel anxious or stressed. Stress and anxiety increase muscle tension and can make back pain worse. Learn ways to manage anxiety and stress, such as through exercise. General instructions  Sleep on a firm mattress in a comfortable position. Try lying on your side with your knees slightly bent. If you lie on your back, put a pillow under your knees.  Follow your treatment plan as told by your health care provider. This may include: ? Cognitive or behavioral therapy. ? Acupuncture or massage therapy. ? Meditation or yoga. Contact a health care provider if:  You have pain that is not relieved with rest or medicine.  You have increasing pain going down into your legs or buttocks.  Your pain does not improve after 2 weeks.  You have pain at night.  You lose weight without trying.  You have a fever or chills. Get help right away if:  You develop new bowel or bladder control problems.  You  have unusual weakness or numbness in your arms or legs.  You develop nausea or vomiting.  You develop abdominal pain.  You feel faint. Summary  Acute back pain is sudden and usually short-lived.  Use proper lifting techniques. When you bend and lift, use positions that put less stress on your back.  Take over-the-counter and prescription medicines and apply heat or ice as directed by your health care provider. This information is not intended to replace advice given to you by your health care provider. Make sure you discuss any questions you have with your health care provider. Document Released: 03/09/2005 Document Revised: 10/14/2017 Document Reviewed: 10/21/2016 Elsevier Interactive Patient Education  2019 [employer].

## 2018-04-19 ENCOUNTER — Ambulatory Visit (INDEPENDENT_AMBULATORY_CARE_PROVIDER_SITE_OTHER): Payer: Medicaid Other | Admitting: Neurology

## 2018-04-19 DIAGNOSIS — G5620 Lesion of ulnar nerve, unspecified upper limb: Secondary | ICD-10-CM | POA: Diagnosis not present

## 2018-04-19 DIAGNOSIS — G5603 Carpal tunnel syndrome, bilateral upper limbs: Secondary | ICD-10-CM

## 2018-04-19 NOTE — Procedures (Addendum)
[facility] Neurology  [address] [place], [address]  [place], [place] [address] Tel: [phone] Fax:  [phone] Test Date:  04/19/2018  Patient: Juan Malone DOB: [birth year] Physician: Nita Sickle, DO  Sex: Male Height: 5\' 10"  Ref Phys: Dairl Ponder, MD  ID#: [id number] Temp: 33.0C Technician:    Patient Complaints: This is a 58 year old man referred for evaluation of left hand paresthesias and numbness.  NCV & EMG Findings: Electrodiagnostic testing was limited to nerve conduction studies only, due to needle phobia associated with electromyography.  Findings are as follows:  1. Bilateral median sensory responses show prolonged latencies (L3.9, R4.1 ms) and amplitude is reduced on the left (10.8 V).  Bilateral ulnar sensory responses show prolonged latencies (L3.9, R3.8 ms) and normal amplitude.  Left radial sensory responses within normal limits. 2. Bilateral median motor responses are within normal limits.  Bilateral ulnar motor responses show slowed conduction velocity across the elbow (A Elbow-B Elbow, L43, R45 m/s).    Impression: This is a limited study, as needle electrode examination was not performed due to needle phobia and therefore cervical radiculopathy/polyradiculoneuropathy could not be assessed.   Nerve conduction studies are suggestive of the following: 1. 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   2. 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   ___________________________ Nita Sickle, DO    Nerve Conduction Studies Anti Sensory Summary Table   Site NR Peak (ms) Norm Peak (ms) P-T Amp (V) Norm P-T Amp  Left Median Anti Sensory (2nd Digit)  33C  Wrist    3.9 <3.6 10.8 >15  Right Median Anti Sensory (2nd Digit)  33C  Wrist    4.1 <3.6 19.4 >15  Left Radial Anti Sensory (Base 1st Digit)  33C  Wrist    2.7 <2.7 16.6 >14  Left Ulnar Anti  Sensory (5th Digit)  33C  Wrist    3.9 <3.1 24.3 >10  Right Ulnar Anti Sensory (5th Digit)  33C  Wrist    3.8 <3.1 22.7 >10   Motor Summary Table   Site NR Onset (ms) Norm Onset (ms) O-P Amp (mV) Norm O-P Amp Site1 Site2 Delta-0 (ms) Dist (cm) Vel (m/s) Norm Vel (m/s)  Left Median Motor (Abd Poll Brev)  33C  Wrist    3.6 <4.0 9.2 >6 Elbow Wrist 5.7 29.0 51 >50  Elbow    9.3  8.6         Right Median Motor (Abd Poll Brev)  33C  Wrist    3.1 <4.0 12.3 >6 Elbow Wrist 5.5 29.0 53 >50  Elbow    8.6  11.7         Left Ulnar Motor (Abd Dig Minimi)  33C  Wrist    2.5 <3.1 8.6 >7 B Elbow Wrist 4.4 25.0 57 >50  B Elbow    6.9  7.0  A Elbow B Elbow 2.3 10.0 43 >50  A Elbow    9.2  6.5         Right Ulnar Motor (Abd Dig Minimi)  33C  Wrist    2.7 <3.1 8.8 >7 B Elbow Wrist 3.9 23.0 59 >50  B Elbow    6.6  8.2  A Elbow B Elbow 2.2 10.0 45 >50  A Elbow    8.8  7.9             Waveforms:

## 2018-04-21 DIAGNOSIS — G5622 Lesion of ulnar nerve, left upper limb: Secondary | ICD-10-CM | POA: Diagnosis not present

## 2018-04-21 DIAGNOSIS — G5603 Carpal tunnel syndrome, bilateral upper limbs: Secondary | ICD-10-CM | POA: Diagnosis not present

## 2018-05-09 DIAGNOSIS — G5602 Carpal tunnel syndrome, left upper limb: Secondary | ICD-10-CM | POA: Diagnosis not present

## 2018-05-09 DIAGNOSIS — G5622 Lesion of ulnar nerve, left upper limb: Secondary | ICD-10-CM | POA: Diagnosis not present

## 2018-07-14 ENCOUNTER — Other Ambulatory Visit: Payer: Self-pay

## 2018-07-14 ENCOUNTER — Encounter: Payer: Self-pay | Admitting: Family Medicine

## 2018-07-14 ENCOUNTER — Ambulatory Visit (INDEPENDENT_AMBULATORY_CARE_PROVIDER_SITE_OTHER): Payer: Medicaid Other | Admitting: Family Medicine

## 2018-07-14 DIAGNOSIS — M545 Low back pain, unspecified: Secondary | ICD-10-CM

## 2018-07-14 DIAGNOSIS — B182 Chronic viral hepatitis C: Secondary | ICD-10-CM | POA: Diagnosis not present

## 2018-07-14 DIAGNOSIS — J4520 Mild intermittent asthma, uncomplicated: Secondary | ICD-10-CM

## 2018-07-14 DIAGNOSIS — I10 Essential (primary) hypertension: Secondary | ICD-10-CM | POA: Diagnosis not present

## 2018-07-14 MED ORDER — GABAPENTIN 300 MG PO CAPS: 300.0000 mg | ORAL_CAPSULE | Freq: Three times a day (TID) | ORAL | 3 refills | Status: DC

## 2018-07-14 MED ORDER — MELOXICAM 15 MG PO TABS: 15.0000 mg | ORAL_TABLET | Freq: Every day | ORAL | 2 refills | Status: DC

## 2018-07-14 NOTE — Progress Notes (Signed)
  [facility]  Virtual Visit via Telephone Note  I connected with Philipe Rought Mates on 07/14/18 at  9:00 AM EDT by telephone and verified that I am speaking with the correct person using two identifiers.   I discussed the limitations, risks, security and privacy concerns of performing an evaluation and management service by telephone and the availability of in person appointments. I also discussed with the patient that there may be a patient responsible charge related to this service. The patient expressed understanding and agreed to proceed.   History of Present Illness: HANIF YANNUZZI  has a past medical history of Asthma, Essential hypertension (06/02/2017), Hepatitis C virus infection without hepatic coma (06/10/2017), and PONV (postoperative nausea and vomiting). Patient reports home monitoring of his BP. He reports systolic and diastolic are both in 120/70-140/90 ranges. He reports compliance with daily medications and does not report side effects. He states that the sciatica has resolved and he is taking gabapentin at night only. He is followed by Dr. Mina Marble at [facility] for bilateral carpal tunnel syndrome post release. Mr. Lassonde has also been seen by NP Dixon in infectious disease regarding hep C.   He is doing well without complaints today.     Observations/Objective: Patient with regular voice tone, rate and rhythm. Speaking calmly and is in no apparent distress.    Assessment and Plan: 1. Essential hypertension  2. Acute right-sided low back pain without sciatica - meloxicam (MOBIC) 15 MG tablet; Take 1 tablet (15 mg total) by mouth daily.  Dispense: 30 tablet; Refill: 2 - gabapentin (NEURONTIN) 300 MG capsule; Take 1 capsule (300 mg total) by mouth 3 (three) times daily.  Dispense: 90 capsule; Refill: 3  3. Chronic hepatitis C without hepatic coma (HCC)  4. Mild intermittent asthma without complication  Summary: Mr.  Kirchoff is followed by specialists for Hep C and carpal tunnel. Refilled medications. He reports BP readings are in range and daily compliance with medications. He will return to care in 3 months. At that time, we will obtain labs.     Follow Up Instructions:  We discussed hand washing, using hand sanitizer when soap and water are not available, only going out when absolutely necessary, and social distancing. Explained to patient that he is immunocompromised and will need to take precautions during this time.   I discussed the assessment and treatment plan with the patient. The patient was provided an opportunity to ask questions and all were answered. The patient agreed with the plan and demonstrated an understanding of the instructions.   The patient was advised to call back or seek an in-person evaluation if the symptoms worsen or if the condition fails to improve as anticipated.  I provided 15 minutes of non-face-to-face time during this encounter.  Ms. Andr L. Riley Lam, FNP-BC [facility] [address] [place], [place] [address] [phone]

## 2018-08-08 IMAGING — US US ABDOMEN COMPLETE W/ ELASTOGRAPHY
1 series · 13 of 16 positions shown · non-contrast
Comparison: None.

CLINICAL DATA: Hepatitis-C



[Series 1: us abdomen complete w/ elastography · 0.25mm/px · 13 of 16 slices shown]
[im 1/16]
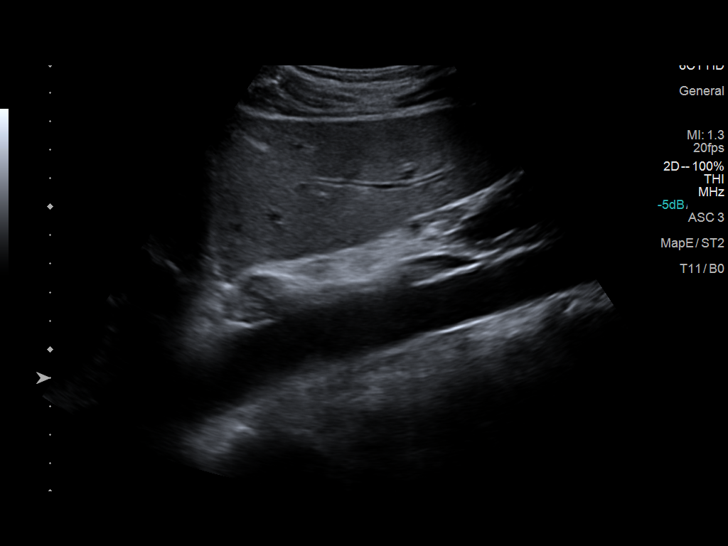
[im 2/16]
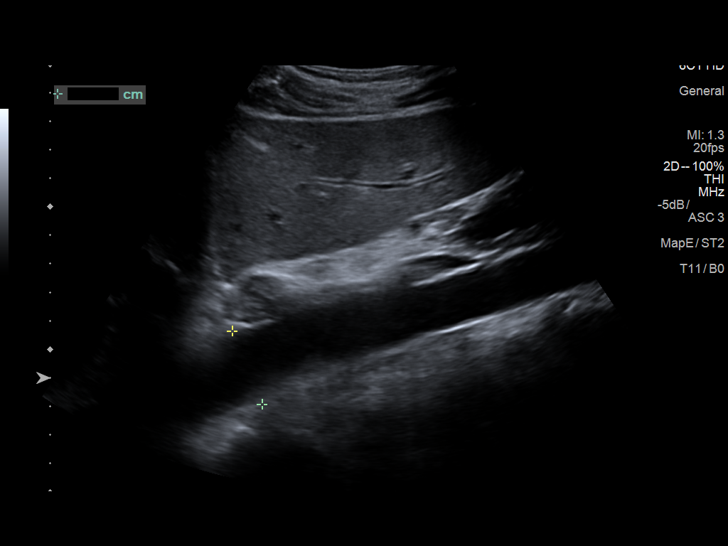
[im 4/16]
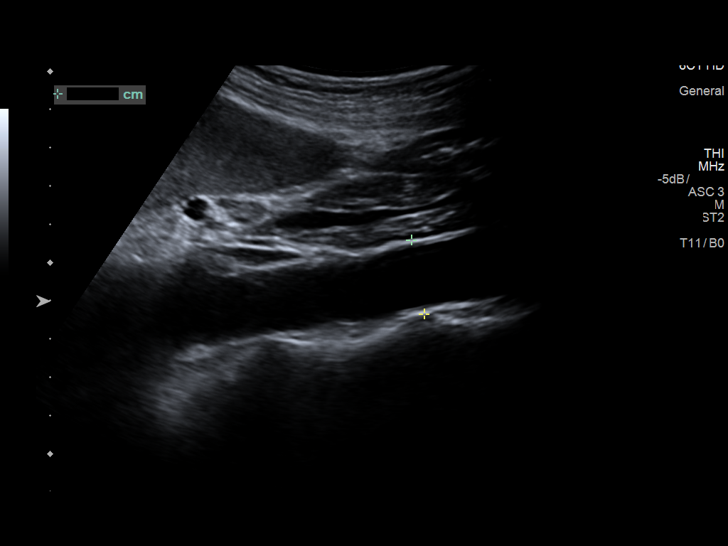
[im 5/16]
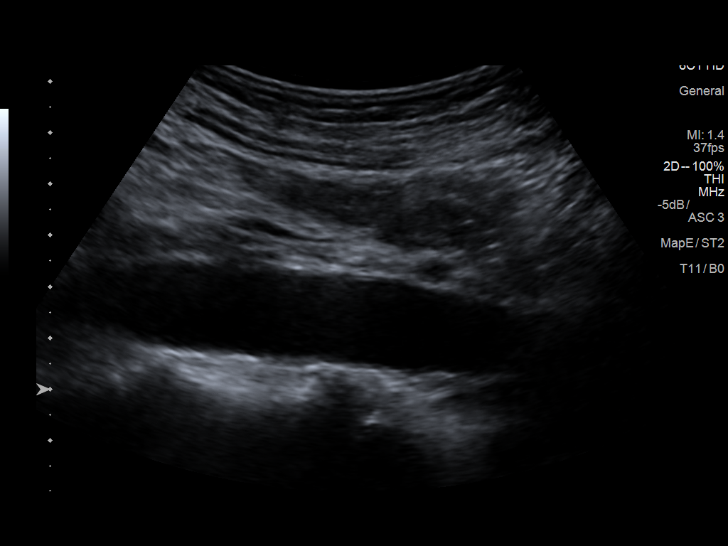
[im 6/16]
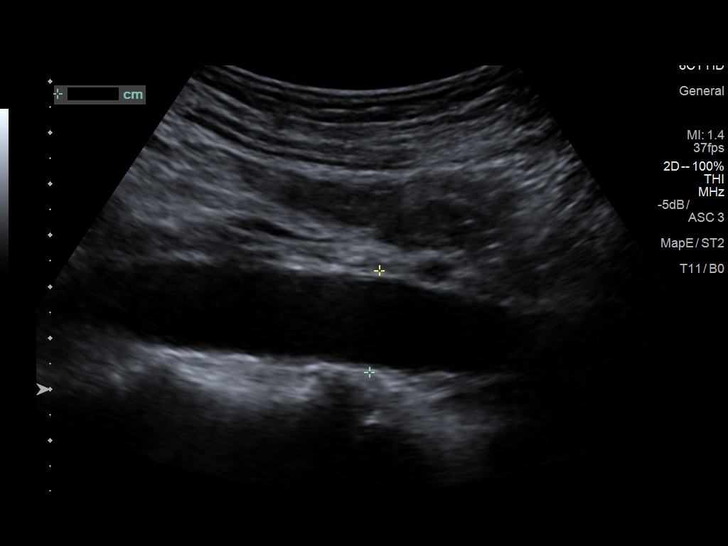
[im 7/16]
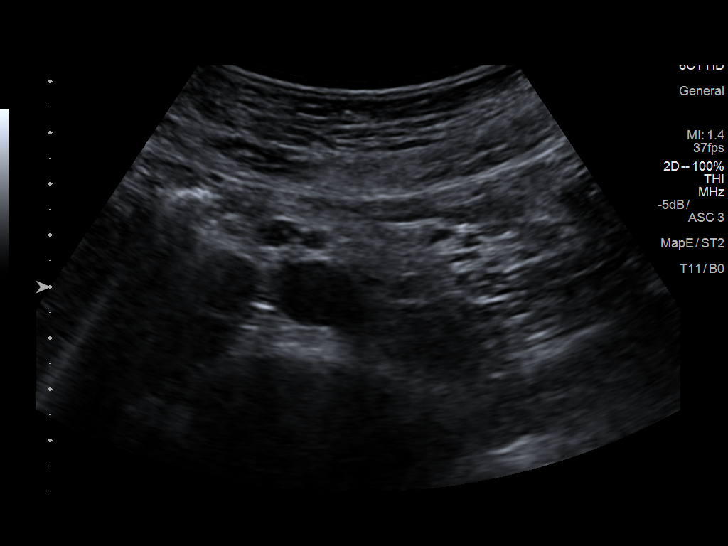
[im 9/16]
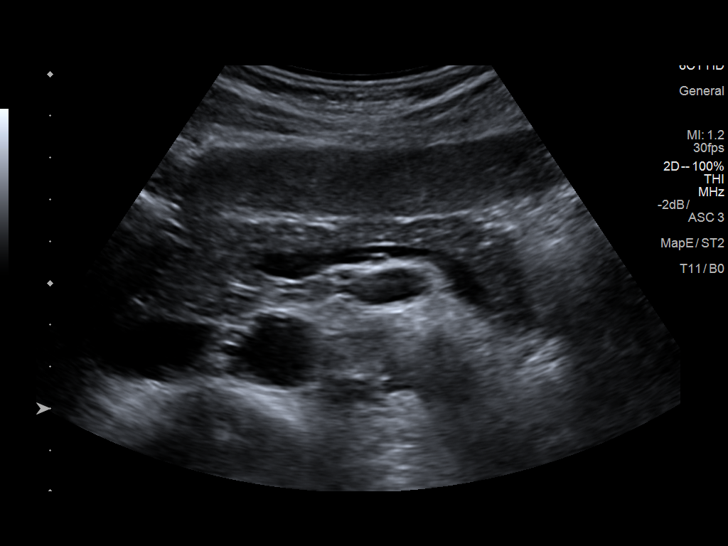
[im 10/16]
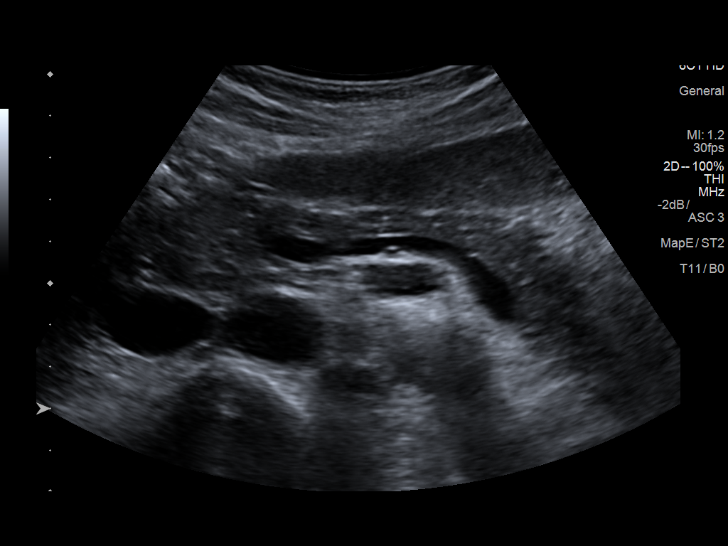
[im 11/16]
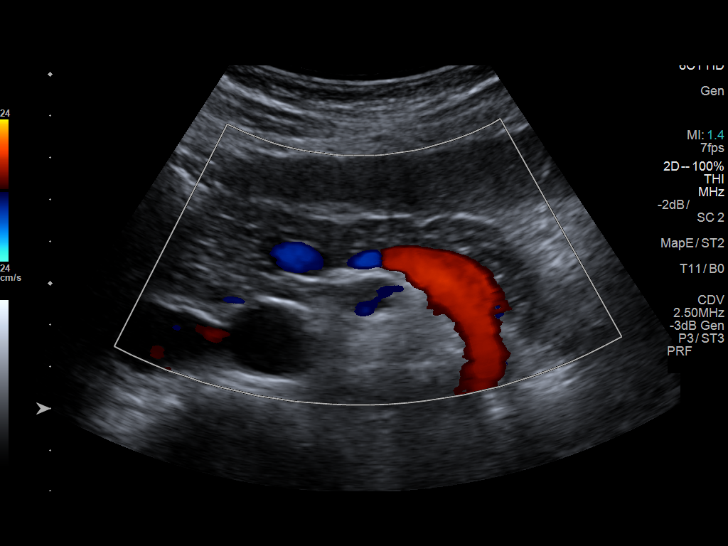
[im 12/16]
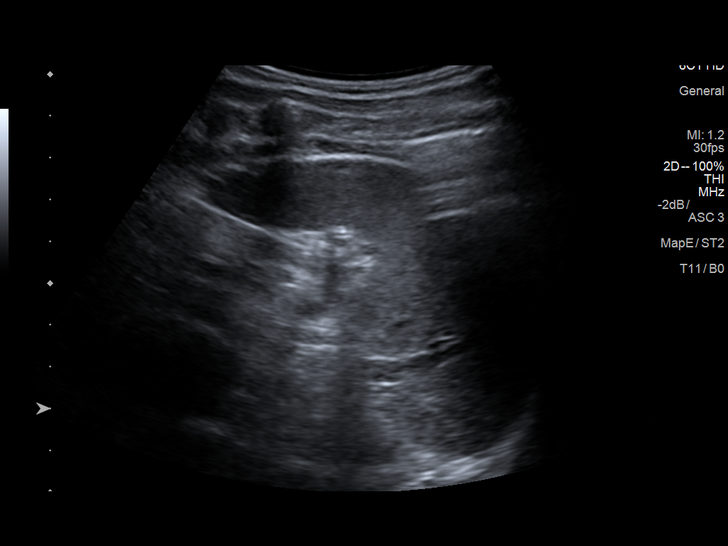
[im 13/16]
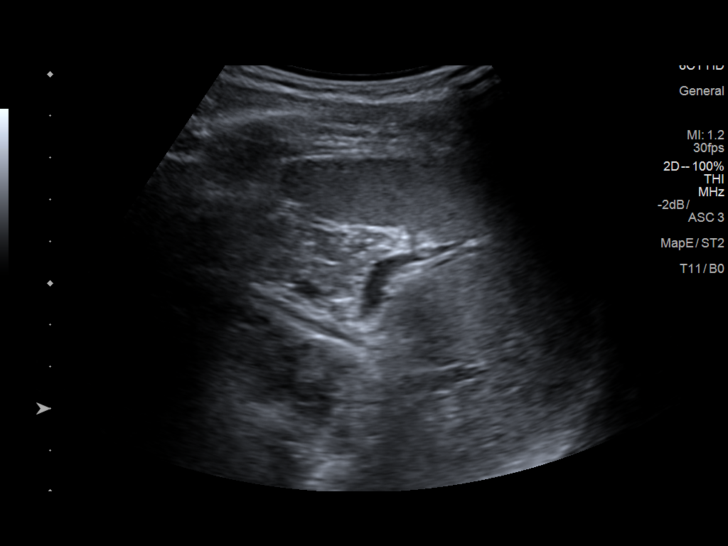
[im 15/16]
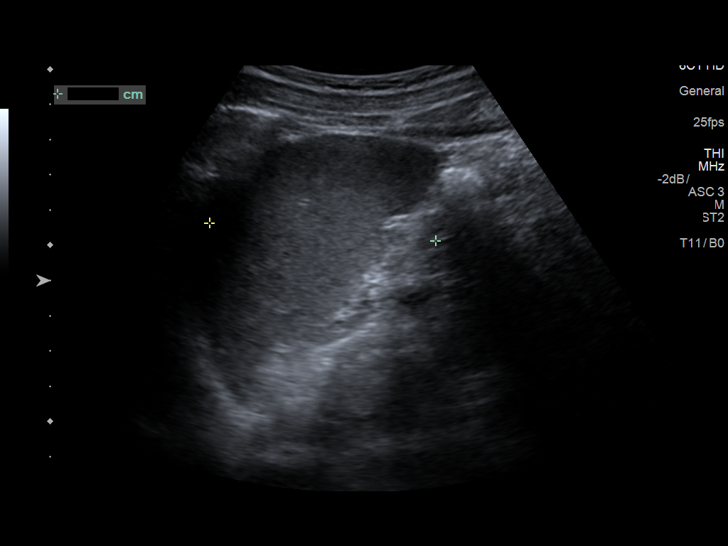
[im 16/16]
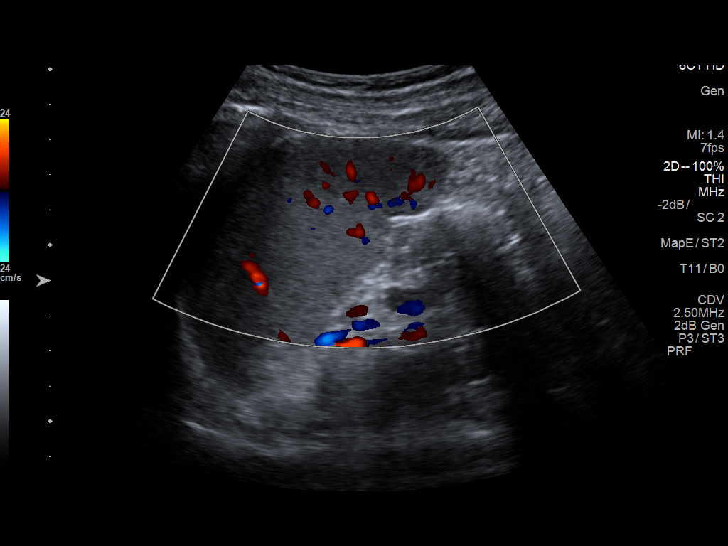

[13 of 16 positions shown; findings below may reference images not displayed]

FINDINGS: ULTRASOUND ABDOMEN

Gallbladder: No gallstones or wall thickening visualized. No
sonographic Murphy sign noted by sonographer.

Common bile duct: Diameter: Normal caliber, 3 mm.

Liver: No focal lesion identified. Within normal limits in
parenchymal echogenicity. Portal vein is patent on color Doppler
imaging with normal direction of blood flow towards the liver.

IVC: No abnormality visualized.

Pancreas: Visualized portion unremarkable.

Spleen: Size and appearance within normal limits.

Right Kidney: Length: 12.5 cm. Echogenicity within normal limits. No
mass or hydronephrosis visualized.

Left Kidney: Length: 11.8 cm. Echogenicity within normal limits. No
mass or hydronephrosis visualized.

Abdominal aorta: No aneurysm visualized.

Other findings: None.

ULTRASOUND HEPATIC ELASTOGRAPHY

Device: Siemens Helix VTQ

Patient position: Supine

Transducer [id number]

Number of measurements: 10

Hepatic segment:  8

Median velocity:   1.35 m/sec

IQR:

IQR/Median velocity ratio:

Corresponding Metavir fibrosis score:  F2 + some F3

Risk of fibrosis: Moderate

Limitations of exam: None

Please note that abnormal shear wave velocities may also be
identified in clinical settings other than with hepatic fibrosis,
such as: acute hepatitis, elevated right heart and central venous
pressures including use of beta blockers, Placidus disease
(Bicicletas), infiltrative processes such as
mastocytosis/amyloidosis/infiltrative tumor, extrahepatic
cholestasis, in the post-prandial state, and liver transplantation.
Correlation with patient history, laboratory data, and clinical
condition recommended.
IMPRESSION: ULTRASOUND ABDOMEN:
Unremarkable abdominal ultrasound.

ULTRASOUND HEPATIC ELASTOGRAPHY:

Median hepatic shear wave velocity is calculated at 1.35 m/sec.

Corresponding Metavir fibrosis score is F2 + some F3.

Risk of fibrosis is Moderate.

Follow-up: Additional testing appropriate

## 2018-10-13 ENCOUNTER — Ambulatory Visit: Payer: Medicaid Other | Admitting: Family Medicine

## 2018-10-26 ENCOUNTER — Encounter: Payer: Self-pay | Admitting: Family Medicine

## 2018-10-26 ENCOUNTER — Other Ambulatory Visit: Payer: Self-pay

## 2018-10-26 ENCOUNTER — Ambulatory Visit (INDEPENDENT_AMBULATORY_CARE_PROVIDER_SITE_OTHER): Payer: Medicaid Other | Admitting: Family Medicine

## 2018-10-26 VITALS — BP 123/84 | HR 72 | Temp 99.0°F | Resp 14 | Ht 70.0 in | Wt 134.0 lb

## 2018-10-26 DIAGNOSIS — E785 Hyperlipidemia, unspecified: Secondary | ICD-10-CM | POA: Diagnosis not present

## 2018-10-26 DIAGNOSIS — I10 Essential (primary) hypertension: Secondary | ICD-10-CM

## 2018-10-26 DIAGNOSIS — B182 Chronic viral hepatitis C: Secondary | ICD-10-CM | POA: Diagnosis not present

## 2018-10-26 DIAGNOSIS — R809 Proteinuria, unspecified: Secondary | ICD-10-CM

## 2018-10-26 DIAGNOSIS — M25551 Pain in right hip: Secondary | ICD-10-CM

## 2018-10-26 LAB — POCT URINALYSIS DIPSTICK
Blood, UA: NEGATIVE
Glucose, UA: NEGATIVE
Ketones, UA: 15
Leukocytes, UA: NEGATIVE
Nitrite, UA: NEGATIVE
Protein, UA: POSITIVE — AB
Spec Grav, UA: 1.025 (ref 1.010–1.025)
Urobilinogen, UA: 1 E.U./dL
pH, UA: 5.5 (ref 5.0–8.0)

## 2018-10-26 NOTE — Progress Notes (Signed)
[facility]   Progress Note: General Provider: Lanae Boast, FNP  SUBJECTIVE:   Juan Malone is a 58 y.o. male who  has a past medical history of Asthma, Essential hypertension (06/02/2017), Hepatitis C virus infection without hepatic coma (06/10/2017), and PONV (postoperative nausea and vomiting).. Patient presents today for Hypertension and Hip Pain (right ) Hypertension This is a chronic problem. The current episode started more than 1 year ago. The problem is controlled. Pertinent negatives include no chest pain. Agents associated with hypertension include NSAIDs. Risk factors for coronary artery disease include family history, male gender and stress. The current treatment provides moderate improvement. Compliance problems include exercise.   Hip Pain  The incident occurred more than 1 week ago. The injury mechanism is unknown. The pain is present in the right hip. The quality of the pain is described as shooting. The pain is moderate. The pain has been constant since onset. Associated symptoms include a loss of motion. He reports no foreign bodies present. The symptoms are aggravated by movement and weight bearing. He has tried NSAIDs, rest and non-weight bearing for the symptoms. The treatment provided mild relief.     Review of Systems  Constitutional: Negative.   HENT: Negative.   Eyes: Negative.   Respiratory: Negative.   Cardiovascular: Negative.  Negative for chest pain.  Gastrointestinal: Negative.   Genitourinary: Negative.   Musculoskeletal: Positive for joint pain.  Skin: Negative.   Neurological: Negative.   Psychiatric/Behavioral: Negative.      OBJECTIVE: BP 123/84 (BP Location: Right Arm, Patient Position: Sitting, Cuff Size: Normal)   Pulse 72   Temp 99 F (37.2 C) (Oral)   Resp 14   Ht 5\' 10"  (1.778 m)   Wt 134 lb (60.8 kg)   SpO2 100%   BMI 19.23 kg/m   Wt Readings from Last 3 Encounters:  11/16/18  137 lb (62.1 kg)  10/26/18 134 lb (60.8 kg)  04/14/18 145 lb (65.8 kg)     Physical Exam Vitals signs and nursing note reviewed.  Constitutional:      General: He is not in acute distress.    Appearance: Normal appearance.  HENT:     Head: Normocephalic and atraumatic.  Eyes:     Extraocular Movements: Extraocular movements intact.     Conjunctiva/sclera: Conjunctivae normal.     Pupils: Pupils are equal, round, and reactive to light.  Cardiovascular:     Rate and Rhythm: Normal rate and regular rhythm.     Heart sounds: No murmur.  Pulmonary:     Effort: Pulmonary effort is normal.     Breath sounds: Normal breath sounds.  Musculoskeletal:     Comments: Limited ROM due to pain in the right hip  Skin:    General: Skin is warm and dry.  Neurological:     Mental Status: He is alert and oriented to person, place, and time.  Psychiatric:        Mood and Affect: Mood normal.        Behavior: Behavior normal.        Thought Content: Thought content normal.        Judgment: Judgment normal.     ASSESSMENT/PLAN:  1. Essential hypertension - Urinalysis Dipstick - Microalbumin/Creatinine Ratio, Urine - lisinopril (ZESTRIL) 5 MG tablet; Take 1 tablet (5 mg total) by mouth daily.  Dispense: 90 tablet; Refill: 3  2. Chronic hepatitis C without hepatic coma ([place])  3. Hyperlipidemia, unspecified  hyperlipidemia type  4. Proteinuria, unspecified type - lisinopril (ZESTRIL) 5 MG tablet; Take 1 tablet (5 mg total) by mouth daily.  Dispense: 90 tablet; Refill: 3  5. Pain of right hip joint - Ambulatory referral to Sports Medicine     Return in about 3 months (around 01/26/2019) for htn.    The patient was given clear instructions to go to ER or return to medical center if symptoms do not improve, worsen or new problems develop. The patient verbalized understanding and agreed with plan of care.   Ms. Freda Jacksonndr L. Riley Lamouglas, FNP-BC [facility] [address]  [place], [place] [id number] [phone]

## 2018-10-26 NOTE — Patient Instructions (Signed)
Proteinuria Proteinuria is when there is too much protein in the urine. Proteins are important for building muscles and bones. Proteins are also needed to fight infections, help the blood to clot, and keep body fluids in balance. Proteinuria may be mild and temporary, or it may be an early sign of kidney disease. The kidneys make urine. Healthy kidneys also keep substances like proteins from leaving the blood and ending up in the urine. What are the causes? This condition may be caused by damage to the kidneys or by temporary causes such as fever or stress. Proteinuria may happen when the kidneys are not working well. Healthy kidneys have filters (glomeruli) that keep proteins out of the urine. Proteinuria may mean that the glomeruli are damaged. The main causes of this type of damage are:  Diabetes.  High blood pressure. Other causes of kidney damage can also cause proteinuria, such as:  Diseases of the immune system, such as lupus, rheumatoid arthritis, sarcoidosis, and Goodpasture syndrome.  Heart disease or heart failure.  Kidney infection.  Certain cancers, including kidney cancer, lymphoma, leukemia, and multiple myeloma.  Amyloidosis. This is a disease that causes abnormal proteins to build up in body tissues.  Reactions to certain medicines, such as NSAIDs.  Injuries or poisons (toxins).  High blood pressure that occurs during pregnancy (preeclampsia and eclampsia). Temporary proteinuria may result from conditions that put stress on the kidneys. These conditions usually do not cause kidney damage. They include:  Fever.  Exposure to cold or heat.  Emotional or physical stress.  Extreme exercise.  Standing for long periods of time. What increases the risk? You are more likely to develop this condition if you:  Have diabetes.  Have high blood pressure.  Have heart disease or heart failure.  Have an immune disease, cancer, or other disease that affects the kidneys.   Have a family history of kidney disease.  Are 54 years of age or older.  Are overweight.  Are of African American, American [place], Hispanic/Latino, or [place] descent.  Are pregnant.  Have an infection. What are the signs or symptoms? Mild proteinuria may not cause symptoms. As more proteins enter the urine, symptoms of kidney disease may develop, such as:  Foamy urine.  Swelling of the face, abdomen, hands, legs, or feet (edema).  Needing to urinate frequently.  Fatigue.  Difficulty sleeping.  Dry and itchy skin.  Nausea and vomiting.  Muscle cramps.  Shortness of breath. How is this diagnosed? This condition may be diagnosed with a urine test. You may have this test as part of a routine physical exam or because you have symptoms of kidney disease or risk factors for kidney disease. You may also have:  Blood tests to measure the level of a certain substance (creatinine) that increases with kidney disease.  Imaging tests of your kidney, such as a CT scan or an ultrasound, to look for signs of kidney damage. How is this treated? If your proteinuria is mild or temporary, treatment may not be needed for this condition. Your health care provider may show you how to monitor the level of protein in your urine at home. Identifying proteinuria early is important so that the cause of the condition can be treated. Treatment for this condition depends on the cause of your proteinuria. Treatment may include:  Making diet and lifestyle changes.  Getting blood pressure under control.  Getting blood sugar under control, if you have diabetes.  Managing any other medical conditions you have that  affect your kidneys.  Giving birth, if you are pregnant.  Avoiding medicines that damage your kidneys. In severe cases, kidney disease may need to be treated with medicines or dialysis. Follow these instructions at home: Activity  Return to your normal activities as told by  your health care provider. Ask your health care provider what activities are safe for you.  Ask your health care provider to recommend an exercise program. General instructions  Check your protein levels at home if directed by your health care provider.  Follow instructions from your health care provider about eating or drinking restrictions.  If you are overweight, ask your health care provider about diets that can help you get to a healthy weight.  Take over-the-counter and prescription medicines only as told by your health care provider.  Keep all follow-up visits as told by your health care provider. This is important. Contact a health care provider if:  You have new symptoms.  Your symptoms get worse or do not improve. Get help right away if you:  Have back pain.  Have diarrhea.  Vomit.  Have a fever.  Have a rash. Summary  Proteinuria is when there is too much protein in the urine.  Proteinuria may be mild and temporary, or it may be an early sign of kidney disease.  This condition may be diagnosed with a urine test.  Treatment for this condition depends on the cause of your proteinuria.  Treatment may include diet and lifestyle changes, blood pressure and blood sugar management, and avoiding medicines that may damage the kidneys. If the proteinuria is severe, it may need to be treated with medicines or dialysis. This information is not intended to replace advice given to you by your health care provider. Make sure you discuss any questions you have with your health care provider. Document Released: 04/29/2005 Document Revised: 10/25/2017 Document Reviewed: 10/25/2017 Elsevier Patient Education  2020 [employer]. Hypertension, Adult Hypertension is another name for high blood pressure. High blood pressure forces your heart to work harder to pump blood. This can cause problems over time. There are two numbers in a blood pressure reading. There is a top number  (systolic) over a bottom number (diastolic). It is best to have a blood pressure that is below 120/80. Healthy choices can help lower your blood pressure, or you may need medicine to help lower it. What are the causes? The cause of this condition is not known. Some conditions may be related to high blood pressure. What increases the risk?  Smoking.  Having type 2 diabetes mellitus, high cholesterol, or both.  Not getting enough exercise or physical activity.  Being overweight.  Having too much fat, sugar, calories, or salt (sodium) in your diet.  Drinking too much alcohol.  Having long-term (chronic) kidney disease.  Having a family history of high blood pressure.  Age. Risk increases with age.  Race. You may be at higher risk if you are African American.  Gender. Men are at higher risk than women before age 39. After age 24, women are at higher risk than men.  Having obstructive sleep apnea.  Stress. What are the signs or symptoms?  High blood pressure may not cause symptoms. Very high blood pressure (hypertensive crisis) may cause: ? Headache. ? Feelings of worry or nervousness (anxiety). ? Shortness of breath. ? Nosebleed. ? A feeling of being sick to your stomach (nausea). ? Throwing up (vomiting). ? Changes in how you see. ? Very bad chest pain. ? Seizures.  How is this treated?  This condition is treated by making healthy lifestyle changes, such as: ? Eating healthy foods. ? Exercising more. ? Drinking less alcohol.  Your health care provider may prescribe medicine if lifestyle changes are not enough to get your blood pressure under control, and if: ? Your top number is above 130. ? Your bottom number is above 80.  Your personal target blood pressure may vary. Follow these instructions at home: Eating and drinking   If told, follow the DASH eating plan. To follow this plan: ? Fill one half of your plate at each meal with fruits and vegetables. ? Fill  one fourth of your plate at each meal with whole grains. Whole grains include whole-wheat pasta, brown rice, and whole-grain bread. ? Eat or drink low-fat dairy products, such as skim milk or low-fat yogurt. ? Fill one fourth of your plate at each meal with low-fat (lean) proteins. Low-fat proteins include fish, chicken without skin, eggs, beans, and tofu. ? Avoid fatty meat, cured and processed meat, or chicken with skin. ? Avoid pre-made or processed food.  Eat less than 1,500 mg of salt each day.  Do not drink alcohol if: ? Your doctor tells you not to drink. ? You are pregnant, may be pregnant, or are planning to become pregnant.  If you drink alcohol: ? Limit how much you use to:  0-1 drink a day for women.  0-2 drinks a day for men. ? Be aware of how much alcohol is in your drink. In the [place] one drink equals one 12 oz bottle of beer (355 mL), one 5 oz glass of wine (148 mL), or one 1 oz glass of hard liquor (44 mL). Lifestyle   Work with your doctor to stay at a healthy weight or to lose weight. Ask your doctor what the best weight is for you.  Get at least 30 minutes of exercise most days of the week. This may include walking, swimming, or biking.  Get at least 30 minutes of exercise that strengthens your muscles (resistance exercise) at least 3 days a week. This may include lifting weights or doing Pilates.  Do not use any products that contain nicotine or tobacco, such as cigarettes, e-cigarettes, and chewing tobacco. If you need help quitting, ask your doctor.  Check your blood pressure at home as told by your doctor.  Keep all follow-up visits as told by your doctor. This is important. Medicines  Take over-the-counter and prescription medicines only as told by your doctor. Follow directions carefully.  Do not skip doses of blood pressure medicine. The medicine does not work as well if you skip doses. Skipping doses also puts you at risk for problems.  Ask your  doctor about side effects or reactions to medicines that you should watch for. Contact a doctor if you:  Think you are having a reaction to the medicine you are taking.  Have headaches that keep coming back (recurring).  Feel dizzy.  Have swelling in your ankles.  Have trouble with your vision. Get help right away if you:  Get a very bad headache.  Start to feel mixed up (confused).  Feel weak or numb.  Feel faint.  Have very bad pain in your: ? Chest. ? Belly (abdomen).  Throw up more than once.  Have trouble breathing. Summary  Hypertension is another name for high blood pressure.  High blood pressure forces your heart to work harder to pump blood.  For most people, a  normal blood pressure is less than 120/80.  Making healthy choices can help lower blood pressure. If your blood pressure does not get lower with healthy choices, you may need to take medicine. This information is not intended to replace advice given to you by your health care provider. Make sure you discuss any questions you have with your health care provider. Document Released: 08/26/2007 Document Revised: 11/17/2017 Document Reviewed: 11/17/2017 Elsevier Patient Education  2020 [employer].

## 2018-10-27 LAB — MICROALBUMIN / CREATININE URINE RATIO
Creatinine, Urine: 463.7 mg/dL
Microalb/Creat Ratio: 15 mg/g creat (ref 0–29)
Microalbumin, Urine: 68.8 ug/mL

## 2018-11-04 MED ORDER — LISINOPRIL 5 MG PO TABS: 5.0000 mg | ORAL_TABLET | Freq: Every day | ORAL | 3 refills | Status: AC

## 2018-11-04 NOTE — Progress Notes (Signed)
I would like to add a low dose of lisinopril because there is protein in the urine. We will try this for a few months to see if this resolves it. If not, I will refer to nephrology.

## 2018-11-09 ENCOUNTER — Telehealth: Payer: Self-pay

## 2018-11-09 NOTE — Telephone Encounter (Signed)
Called and spoke with patient, advised that we have added lisinopril due to protein in the urine and to take every day for a few months to see if this resolves. Patient verbalized understanding. Thanks!

## 2018-11-09 NOTE — Telephone Encounter (Signed)
-----   Message from Lanae Boast, [place] sent at 11/04/2018  4:24 PM EDT ----- I would like to add a low dose of lisinopril because there is protein in the urine. We will try this for a few months to see if this resolves it. If not, I will refer to nephrology.

## 2018-11-16 ENCOUNTER — Other Ambulatory Visit: Payer: Self-pay

## 2018-11-16 ENCOUNTER — Encounter: Payer: Self-pay | Admitting: Family Medicine

## 2018-11-16 ENCOUNTER — Ambulatory Visit (INDEPENDENT_AMBULATORY_CARE_PROVIDER_SITE_OTHER): Payer: Medicaid Other | Admitting: Family Medicine

## 2018-11-16 VITALS — BP 122/88 | HR 71 | Temp 98.7°F | Resp 16 | Ht 70.0 in | Wt 137.0 lb

## 2018-11-16 DIAGNOSIS — M549 Dorsalgia, unspecified: Secondary | ICD-10-CM | POA: Diagnosis not present

## 2018-11-16 DIAGNOSIS — M5430 Sciatica, unspecified side: Secondary | ICD-10-CM | POA: Diagnosis not present

## 2018-11-16 DIAGNOSIS — G5622 Lesion of ulnar nerve, left upper limb: Secondary | ICD-10-CM | POA: Diagnosis not present

## 2018-11-16 DIAGNOSIS — G5603 Carpal tunnel syndrome, bilateral upper limbs: Secondary | ICD-10-CM

## 2018-11-16 DIAGNOSIS — M25561 Pain in right knee: Secondary | ICD-10-CM

## 2018-11-16 NOTE — Progress Notes (Signed)
  [facility]   Progress Note: Sick Visit Provider: Lanae Boast, FNP  SUBJECTIVE:   Juan Malone is a 58 y.o. male who  has a past medical history of Asthma, Essential hypertension (06/02/2017), Hepatitis C virus infection without hepatic coma (06/10/2017), and PONV (postoperative nausea and vomiting).. Patient presents today for Follow-up (needs paper work filled out for disability ), Hand Pain (left hand and elbow pain ), Knee Pain (right knee ), and Shoulder Pain (right shoulder ) Patient states that he is working with attorney Lucretia Field, [place] for disability and SI benefits. He reports a history patella fracture of the right knee. Also has bilateral carpal tunnel with left carpal and cubital tunnel release on 05/09/2018.  Review of Systems  Musculoskeletal: Positive for joint pain.  All other systems reviewed and are negative.    OBJECTIVE: BP 122/88 (BP Location: Left Arm, Patient Position: Sitting, Cuff Size: Normal)   Pulse 71   Temp 98.7 F (37.1 C) (Oral)   Resp 16   Ht 5\' 10"  (1.778 m)   Wt 137 lb (62.1 kg)   SpO2 100%   BMI 19.66 kg/m   Wt Readings from Last 3 Encounters:  11/16/18 137 lb (62.1 kg)  10/26/18 134 lb (60.8 kg)  04/14/18 145 lb (65.8 kg)     Physical Exam Vitals signs and nursing note reviewed.  Constitutional:      General: He is not in acute distress.    Appearance: Normal appearance.  HENT:     Head: Normocephalic and atraumatic.  Eyes:     Extraocular Movements: Extraocular movements intact.     Conjunctiva/sclera: Conjunctivae normal.     Pupils: Pupils are equal, round, and reactive to light.  Cardiovascular:     Rate and Rhythm: Normal rate and regular rhythm.     Heart sounds: No murmur.  Pulmonary:     Effort: Pulmonary effort is normal.     Breath sounds: Normal breath sounds.  Musculoskeletal:     Left elbow: He exhibits decreased range of motion.     Right wrist: Normal.   Left wrist: He exhibits decreased range of motion.     Right knee: He exhibits decreased range of motion and swelling.  Skin:    General: Skin is warm and dry.  Neurological:     Mental Status: He is alert and oriented to person, place, and time.  Psychiatric:        Mood and Affect: Mood normal.        Behavior: Behavior normal.        Thought Content: Thought content normal.        Judgment: Judgment normal.     ASSESSMENT/PLAN:   1. Right knee pain, unspecified chronicity  2. Back pain with sciatica  3. Carpal tunnel syndrome on both sides  4. Cubital tunnel syndrome on left Paper work filled out with patient present today.         The patient was given clear instructions to go to ER or return to medical center if symptoms do not improve, worsen or new problems develop. The patient verbalized understanding and agreed with plan of care.   Ms. Doug Sou. Nathaneil Canary, FNP-BC [facility] [address] [place], [place] [address] [phone]     This note has been created with Dragon speech recognition software and smart phrase technology. Any transcriptional errors are unintentional.

## 2018-11-30 ENCOUNTER — Encounter (HOSPITAL_COMMUNITY): Payer: Self-pay

## 2018-11-30 ENCOUNTER — Encounter (HOSPITAL_COMMUNITY): Payer: Self-pay | Admitting: *Deleted

## 2019-01-12 ENCOUNTER — Telehealth: Payer: Self-pay | Admitting: Internal Medicine

## 2019-01-19 NOTE — Telephone Encounter (Signed)
done

## 2019-01-26 ENCOUNTER — Ambulatory Visit: Payer: Medicaid Other | Admitting: Family Medicine

## 2019-02-14 ENCOUNTER — Other Ambulatory Visit: Payer: Self-pay | Admitting: Family Medicine

## 2019-02-14 ENCOUNTER — Ambulatory Visit: Payer: Medicaid Other | Admitting: Family Medicine

## 2019-02-14 DIAGNOSIS — M545 Low back pain, unspecified: Secondary | ICD-10-CM

## 2019-02-27 ENCOUNTER — Other Ambulatory Visit: Payer: Self-pay | Admitting: Family Medicine

## 2019-02-27 DIAGNOSIS — M545 Low back pain, unspecified: Secondary | ICD-10-CM

## 2019-04-14 ENCOUNTER — Other Ambulatory Visit: Payer: Self-pay | Admitting: Family Medicine

## 2019-04-14 DIAGNOSIS — I10 Essential (primary) hypertension: Secondary | ICD-10-CM

## 2019-06-08 ENCOUNTER — Other Ambulatory Visit: Payer: Self-pay | Admitting: Internal Medicine

## 2019-06-08 DIAGNOSIS — M545 Low back pain, unspecified: Secondary | ICD-10-CM

## 2019-06-08 NOTE — Telephone Encounter (Signed)
Please route to appropriate PCP in the [facility] and advise patient accordingly.

## 2019-07-12 ENCOUNTER — Other Ambulatory Visit: Payer: Self-pay | Admitting: Internal Medicine

## 2019-07-12 DIAGNOSIS — M545 Low back pain, unspecified: Secondary | ICD-10-CM

## 2019-08-02 ENCOUNTER — Other Ambulatory Visit: Payer: Self-pay | Admitting: Internal Medicine

## 2019-08-02 ENCOUNTER — Other Ambulatory Visit: Payer: Self-pay | Admitting: Family Medicine

## 2019-08-02 DIAGNOSIS — I10 Essential (primary) hypertension: Secondary | ICD-10-CM

## 2019-08-02 DIAGNOSIS — M545 Low back pain, unspecified: Secondary | ICD-10-CM

## 2019-08-02 DIAGNOSIS — R809 Proteinuria, unspecified: Secondary | ICD-10-CM

## 2019-10-09 ENCOUNTER — Other Ambulatory Visit: Payer: Self-pay | Admitting: Internal Medicine

## 2019-10-09 ENCOUNTER — Other Ambulatory Visit: Payer: Self-pay | Admitting: Family Medicine

## 2019-10-09 DIAGNOSIS — I10 Essential (primary) hypertension: Secondary | ICD-10-CM

## 2019-10-09 DIAGNOSIS — M545 Low back pain, unspecified: Secondary | ICD-10-CM

## 2019-10-09 DIAGNOSIS — R809 Proteinuria, unspecified: Secondary | ICD-10-CM

## 2020-01-03 ENCOUNTER — Other Ambulatory Visit: Payer: Self-pay | Admitting: Internal Medicine

## 2020-01-03 ENCOUNTER — Other Ambulatory Visit: Payer: Self-pay | Admitting: Family Medicine

## 2020-01-03 DIAGNOSIS — M545 Low back pain, unspecified: Secondary | ICD-10-CM

## 2020-01-03 DIAGNOSIS — R809 Proteinuria, unspecified: Secondary | ICD-10-CM

## 2020-01-03 DIAGNOSIS — I10 Essential (primary) hypertension: Secondary | ICD-10-CM

## 2020-01-05 ENCOUNTER — Other Ambulatory Visit: Payer: Self-pay | Admitting: Internal Medicine

## 2020-01-05 DIAGNOSIS — I10 Essential (primary) hypertension: Secondary | ICD-10-CM

## 2020-04-08 ENCOUNTER — Ambulatory Visit: Payer: Medicaid Other | Admitting: Nurse Practitioner

## 2020-04-26 ENCOUNTER — Ambulatory Visit: Payer: Medicaid Other | Admitting: Nurse Practitioner

## 2021-11-17 ENCOUNTER — Ambulatory Visit: Payer: Medicaid Other | Admitting: Podiatry
